# Patient Record
Sex: Female | Born: 1995 | Race: White | Hispanic: No | Marital: Single | Smoking: Current every day smoker
Health system: Southern US, Community
[De-identification: ages and names within clinical notes are randomized; demographics above are authoritative.]

## PROBLEM LIST (undated history)

## (undated) DIAGNOSIS — G8929 Other chronic pain: Secondary | ICD-10-CM

## (undated) DIAGNOSIS — F419 Anxiety disorder, unspecified: Secondary | ICD-10-CM

## (undated) DIAGNOSIS — F32A Depression, unspecified: Secondary | ICD-10-CM

## (undated) DIAGNOSIS — F319 Bipolar disorder, unspecified: Secondary | ICD-10-CM

## (undated) DIAGNOSIS — F329 Major depressive disorder, single episode, unspecified: Secondary | ICD-10-CM

## (undated) DIAGNOSIS — F4310 Post-traumatic stress disorder, unspecified: Secondary | ICD-10-CM

## (undated) DIAGNOSIS — R102 Pelvic and perineal pain: Secondary | ICD-10-CM

## (undated) DIAGNOSIS — N809 Endometriosis, unspecified: Secondary | ICD-10-CM

## (undated) HISTORY — PX: OTHER SURGICAL HISTORY: SHX169

---
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
## 2015-09-08 ENCOUNTER — Encounter
Admission: EM | Disposition: A | Discharge status: Home / Self Care | Payer: Self-pay | Source: Home / Self Care | Attending: Obstetrics & Gynecology

## 2015-09-08 ENCOUNTER — Inpatient Hospital Stay: Payer: Medicaid Other | Admitting: Anesthesiology

## 2015-09-08 ENCOUNTER — Inpatient Hospital Stay
Admission: EM | Admit: 2015-09-08 | Discharge: 2015-09-10 | DRG: 765 | Disposition: A | Discharge status: Home / Self Care | Payer: Medicaid Other | Attending: Obstetrics & Gynecology | Admitting: Obstetrics & Gynecology

## 2015-09-08 DIAGNOSIS — O99324 Drug use complicating childbirth: Secondary | ICD-10-CM | POA: Diagnosis present

## 2015-09-08 DIAGNOSIS — Z6741 Type O blood, Rh negative: Secondary | ICD-10-CM | POA: Diagnosis not present

## 2015-09-08 DIAGNOSIS — D62 Acute posthemorrhagic anemia: Secondary | ICD-10-CM | POA: Diagnosis present

## 2015-09-08 DIAGNOSIS — F1190 Opioid use, unspecified, uncomplicated: Secondary | ICD-10-CM | POA: Diagnosis present

## 2015-09-08 DIAGNOSIS — O99334 Smoking (tobacco) complicating childbirth: Secondary | ICD-10-CM | POA: Diagnosis present

## 2015-09-08 DIAGNOSIS — O34211 Maternal care for low transverse scar from previous cesarean delivery: Secondary | ICD-10-CM | POA: Diagnosis present

## 2015-09-08 DIAGNOSIS — O9902 Anemia complicating childbirth: Secondary | ICD-10-CM | POA: Diagnosis present

## 2015-09-08 DIAGNOSIS — Z3A38 38 weeks gestation of pregnancy: Secondary | ICD-10-CM

## 2015-09-08 DIAGNOSIS — O479 False labor, unspecified: Secondary | ICD-10-CM | POA: Diagnosis present

## 2015-09-08 DIAGNOSIS — Z98891 History of uterine scar from previous surgery: Secondary | ICD-10-CM

## 2015-09-08 LAB — CHLAMYDIA/NGC RT PCR (ARMC ONLY)
CHLAMYDIA TR: NOT DETECTED
N GONORRHOEAE: NOT DETECTED

## 2015-09-08 LAB — CBC WITH DIFFERENTIAL/PLATELET
BASOS ABS: 0 10*3/uL (ref 0–0.1)
EOS ABS: 0 10*3/uL (ref 0–0.7)
HCT: 34.7 % — ABNORMAL LOW (ref 35.0–47.0)
Hemoglobin: 11.7 g/dL — ABNORMAL LOW (ref 12.0–16.0)
Lymphocytes Relative: 4 %
Lymphs Abs: 0.5 10*3/uL — ABNORMAL LOW (ref 1.0–3.6)
MCH: 30.9 pg (ref 26.0–34.0)
MCHC: 33.6 g/dL (ref 32.0–36.0)
MCV: 91.9 fL (ref 80.0–100.0)
MONO ABS: 0.2 10*3/uL (ref 0.2–0.9)
Monocytes Relative: 2 %
Neutro Abs: 10.9 10*3/uL — ABNORMAL HIGH (ref 1.4–6.5)
Neutrophils Relative %: 94 %
Platelets: 279 10*3/uL (ref 150–440)
RBC: 3.77 MIL/uL — ABNORMAL LOW (ref 3.80–5.20)
RDW: 13.6 % (ref 11.5–14.5)
WBC: 11.6 10*3/uL — ABNORMAL HIGH (ref 3.6–11.0)

## 2015-09-08 LAB — TYPE AND SCREEN
ABO/RH(D): O NEG
ANTIBODY SCREEN: NEGATIVE

## 2015-09-08 LAB — URINE DRUG SCREEN, QUALITATIVE (ARMC ONLY)
Amphetamines, Ur Screen: NOT DETECTED
Barbiturates, Ur Screen: NOT DETECTED
Benzodiazepine, Ur Scrn: NOT DETECTED
CANNABINOID 50 NG, UR ~~LOC~~: NOT DETECTED
COCAINE METABOLITE, UR ~~LOC~~: NOT DETECTED
MDMA (Ecstasy)Ur Screen: NOT DETECTED
METHADONE SCREEN, URINE: NOT DETECTED
Opiate, Ur Screen: NOT DETECTED
Phencyclidine (PCP) Ur S: NOT DETECTED
Tricyclic, Ur Screen: NOT DETECTED

## 2015-09-08 LAB — RAPID HIV SCREEN (HIV 1/2 AB+AG)
HIV 1/2 ANTIBODIES: NONREACTIVE
HIV-1 P24 Antigen - HIV24: NONREACTIVE

## 2015-09-08 SURGERY — Surgical Case
Anesthesia: Spinal | Wound class: Clean Contaminated

## 2015-09-08 MED ORDER — LACTATED RINGERS IV SOLN
INTRAVENOUS | Status: DC
  Administered 2015-09-08: 15:00:00 via INTRAVENOUS

## 2015-09-08 MED ORDER — FENTANYL CITRATE (PF) 100 MCG/2ML IJ SOLN
INTRAMUSCULAR | Status: AC
  Administered 2015-09-08: 50 ug via INTRAVENOUS
  Filled 2015-09-08: qty 2

## 2015-09-08 MED ORDER — SOD CITRATE-CITRIC ACID 500-334 MG/5ML PO SOLN
ORAL | Status: AC
  Administered 2015-09-08: 30 mL
  Filled 2015-09-08: qty 15

## 2015-09-08 MED ORDER — OXYCODONE HCL 5 MG/5ML PO SOLN: 5.0000 mg | Freq: Once | ORAL | Status: AC | PRN

## 2015-09-08 MED ORDER — DIBUCAINE 1 % RE OINT
1.0000 | TOPICAL_OINTMENT | RECTAL | Status: DC | PRN
Start: 2015-09-08 — End: 2015-09-10

## 2015-09-08 MED ORDER — OXYCODONE HCL 5 MG PO TABS
5.0000 mg | ORAL_TABLET | Freq: Once | ORAL | Status: AC | PRN
Start: 2015-09-08 — End: 2015-09-09
  Administered 2015-09-09: 5 mg via ORAL
  Filled 2015-09-08: qty 1

## 2015-09-08 MED ORDER — FENTANYL CITRATE (PF) 100 MCG/2ML IJ SOLN
25.0000 ug | INTRAMUSCULAR | Status: DC | PRN
  Administered 2015-09-08 (×3): 50 ug via INTRAVENOUS
  Filled 2015-09-08: qty 2

## 2015-09-08 MED ORDER — BUPIVACAINE HCL (PF) 0.5 % IJ SOLN
INTRAMUSCULAR | Status: DC | PRN
  Administered 2015-09-08: 10 mL

## 2015-09-08 MED ORDER — KETOROLAC TROMETHAMINE 30 MG/ML IJ SOLN
30.0000 mg | Freq: Four times a day (QID) | INTRAMUSCULAR | Status: AC
  Administered 2015-09-08 – 2015-09-09 (×4): 30 mg via INTRAVENOUS
  Filled 2015-09-08 (×4): qty 1

## 2015-09-08 MED ORDER — COCONUT OIL OIL: 1.0000 "application " | TOPICAL_OIL | Status: DC | PRN

## 2015-09-08 MED ORDER — BUPIVACAINE 0.25 % ON-Q PUMP DUAL CATH 400 ML
400.0000 mL | INJECTION | Status: DC
  Filled 2015-09-08: qty 400

## 2015-09-08 MED ORDER — SODIUM CHLORIDE FLUSH 0.9 % IV SOLN
INTRAVENOUS | Status: AC
  Filled 2015-09-08: qty 20

## 2015-09-08 MED ORDER — OXYTOCIN 40 UNITS IN LACTATED RINGERS INFUSION - SIMPLE MED: INTRAVENOUS | Status: DC | PRN

## 2015-09-08 MED ORDER — BUPIVACAINE HCL 0.5 % IJ SOLN
10.0000 mL | Freq: Once | INTRAMUSCULAR | Status: DC
  Filled 2015-09-08: qty 10

## 2015-09-08 MED ORDER — WITCH HAZEL-GLYCERIN EX PADS: 1.0000 "application " | MEDICATED_PAD | CUTANEOUS | Status: DC | PRN

## 2015-09-08 MED ORDER — ZOLPIDEM TARTRATE 5 MG PO TABS: 5.0000 mg | ORAL_TABLET | Freq: Every evening | ORAL | Status: DC | PRN

## 2015-09-08 MED ORDER — SIMETHICONE 80 MG PO CHEW
80.0000 mg | CHEWABLE_TABLET | Freq: Three times a day (TID) | ORAL | Status: DC
  Administered 2015-09-09 – 2015-09-10 (×5): 80 mg via ORAL
  Filled 2015-09-08 (×5): qty 1

## 2015-09-08 MED ORDER — ACETAMINOPHEN 325 MG PO TABS
650.0000 mg | ORAL_TABLET | ORAL | Status: DC | PRN
Start: 2015-09-08 — End: 2015-09-08

## 2015-09-08 MED ORDER — SIMETHICONE 80 MG PO CHEW
80.0000 mg | CHEWABLE_TABLET | ORAL | Status: DC
Start: 2015-09-09 — End: 2015-09-10
  Administered 2015-09-08 – 2015-09-09 (×2): 80 mg via ORAL
  Filled 2015-09-08 (×2): qty 1

## 2015-09-08 MED ORDER — PHENYLEPHRINE HCL 10 MG/ML IJ SOLN
INTRAMUSCULAR | Status: DC | PRN
  Administered 2015-09-08 (×2): 100 ug via INTRAVENOUS

## 2015-09-08 MED ORDER — BUPIVACAINE IN DEXTROSE 0.75-8.25 % IT SOLN
INTRATHECAL | Status: DC | PRN
  Administered 2015-09-08: 1.8 mL via INTRATHECAL

## 2015-09-08 MED ORDER — PRENATAL MULTIVITAMIN CH
1.0000 | ORAL_TABLET | Freq: Every day | ORAL | Status: DC
Start: 2015-09-09 — End: 2015-09-10
  Administered 2015-09-09 – 2015-09-10 (×2): 1 via ORAL
  Filled 2015-09-08 (×2): qty 1

## 2015-09-08 MED ORDER — ONDANSETRON HCL 4 MG/2ML IJ SOLN
4.0000 mg | Freq: Once | INTRAMUSCULAR | Status: AC
  Administered 2015-09-08: 4 mg via INTRAVENOUS
  Filled 2015-09-08: qty 2

## 2015-09-08 MED ORDER — TETANUS-DIPHTH-ACELL PERTUSSIS 5-2.5-18.5 LF-MCG/0.5 IM SUSP: 0.5000 mL | Freq: Once | INTRAMUSCULAR | Status: DC

## 2015-09-08 MED ORDER — SIMETHICONE 80 MG PO CHEW: 80.0000 mg | CHEWABLE_TABLET | ORAL | Status: DC | PRN

## 2015-09-08 MED ORDER — SENNOSIDES-DOCUSATE SODIUM 8.6-50 MG PO TABS
2.0000 | ORAL_TABLET | ORAL | Status: DC
  Administered 2015-09-08 – 2015-09-09 (×2): 2 via ORAL
  Filled 2015-09-08 (×2): qty 2

## 2015-09-08 MED ORDER — OXYTOCIN 40 UNITS IN LACTATED RINGERS INFUSION - SIMPLE MED: 2.5000 [IU]/h | INTRAVENOUS | Status: AC

## 2015-09-08 MED ORDER — MENTHOL 3 MG MT LOZG: 1.0000 | LOZENGE | OROMUCOSAL | Status: DC | PRN

## 2015-09-08 MED ORDER — ACETAMINOPHEN 325 MG PO TABS: 650.0000 mg | ORAL_TABLET | ORAL | Status: DC | PRN

## 2015-09-08 MED ORDER — HYDROMORPHONE HCL 1 MG/ML IJ SOLN
1.0000 mg | INTRAMUSCULAR | Status: DC | PRN
  Administered 2015-09-08 – 2015-09-09 (×10): 1 mg via INTRAVENOUS
  Filled 2015-09-08 (×10): qty 1

## 2015-09-08 MED ORDER — PRENATAL MULTIVITAMIN CH: 1.0000 | ORAL_TABLET | Freq: Every day | ORAL | Status: DC

## 2015-09-08 MED ORDER — CEFOXITIN SODIUM-DEXTROSE 2-2.2 GM-% IV SOLR (PREMIX)
INTRAVENOUS | Status: AC
  Filled 2015-09-08: qty 50

## 2015-09-08 MED ORDER — NICOTINE 14 MG/24HR TD PT24
14.0000 mg | MEDICATED_PATCH | Freq: Every day | TRANSDERMAL | Status: DC | PRN
  Administered 2015-09-08 – 2015-09-09 (×2): 14 mg via TRANSDERMAL
  Filled 2015-09-08 (×2): qty 1

## 2015-09-08 MED ORDER — ONDANSETRON HCL 4 MG/2ML IJ SOLN: 4.0000 mg | Freq: Four times a day (QID) | INTRAMUSCULAR | Status: DC | PRN

## 2015-09-08 MED ORDER — BUPIVACAINE HCL (PF) 0.5 % IJ SOLN
INTRAMUSCULAR | Status: AC
  Filled 2015-09-08: qty 30

## 2015-09-08 MED ORDER — OXYTOCIN 40 UNITS IN LACTATED RINGERS INFUSION - SIMPLE MED
INTRAVENOUS | Status: AC
  Administered 2015-09-08: 1 mL via INTRAVENOUS
  Administered 2015-09-08: 299 mL via INTRAVENOUS
  Filled 2015-09-08: qty 1000

## 2015-09-08 MED ORDER — DIPHENHYDRAMINE HCL 25 MG PO CAPS: 25.0000 mg | ORAL_CAPSULE | Freq: Four times a day (QID) | ORAL | Status: DC | PRN

## 2015-09-08 MED ORDER — LACTATED RINGERS IV SOLN
INTRAVENOUS | Status: DC
  Administered 2015-09-09: 01:00:00 via INTRAVENOUS

## 2015-09-08 MED ORDER — CALCIUM CARBONATE ANTACID 500 MG PO CHEW: 2.0000 | CHEWABLE_TABLET | ORAL | Status: DC | PRN

## 2015-09-08 SURGICAL SUPPLY — 23 items
CANISTER SUCT 3000ML (MISCELLANEOUS) ×3 IMPLANT
CATH KIT ON-Q SILVERSOAK 5IN (CATHETERS) ×6 IMPLANT
CHLORAPREP W/TINT 26ML (MISCELLANEOUS) ×6 IMPLANT
DRSG OPSITE POSTOP 4X10 (GAUZE/BANDAGES/DRESSINGS) ×3 IMPLANT
ELECT CAUTERY BLADE 6.4 (BLADE) ×3 IMPLANT
ELECT REM PT RETURN 9FT ADLT (ELECTROSURGICAL) ×3
ELECTRODE REM PT RTRN 9FT ADLT (ELECTROSURGICAL) ×1 IMPLANT
GLOVE SKINSENSE NS SZ8.0 LF (GLOVE) ×2
GLOVE SKINSENSE STRL SZ8.0 LF (GLOVE) ×1 IMPLANT
GOWN STRL REUS W/ TWL LRG LVL3 (GOWN DISPOSABLE) ×1 IMPLANT
GOWN STRL REUS W/ TWL XL LVL3 (GOWN DISPOSABLE) ×2 IMPLANT
GOWN STRL REUS W/TWL LRG LVL3 (GOWN DISPOSABLE) ×2
GOWN STRL REUS W/TWL XL LVL3 (GOWN DISPOSABLE) ×4
LIQUID BAND (GAUZE/BANDAGES/DRESSINGS) ×3 IMPLANT
NS IRRIG 1000ML POUR BTL (IV SOLUTION) ×3 IMPLANT
PACK C SECTION AR (MISCELLANEOUS) ×3 IMPLANT
PAD OB MATERNITY 4.3X12.25 (PERSONAL CARE ITEMS) ×3 IMPLANT
PAD PREP 24X41 OB/GYN DISP (PERSONAL CARE ITEMS) ×3 IMPLANT
SUT MAXON ABS #0 GS21 30IN (SUTURE) ×6 IMPLANT
SUT VIC AB 0 CT1 36 (SUTURE) ×6 IMPLANT
SUT VIC AB 1 CT1 36 (SUTURE) ×12 IMPLANT
SUT VIC AB 2-0 CT1 36 (SUTURE) ×3 IMPLANT
SUT VIC AB 4-0 FS2 27 (SUTURE) ×3 IMPLANT

## 2015-09-08 NOTE — Plan of Care (Signed)
Problem: Education: Goal: Knowledge of Childbirth will improve Outcome: Completed/Met Date Met:  09/08/15 G2P1, 38.4 wks, Repeat c-section performed per Dr Kenton Kingfisher, delivery viable female infant at 37. 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 Incision remains dry and intact, skin adhesive and honeycomb dressing with no drainage or bleeding. Fundus firm at U, small lochia rubra, no clots. Pt given Toradol, Dilaudid and Fentanyl during recovery period. Pt discharged from PACU in stable condition, alert and oriented, able to lift up and bend knees. VSS, afebrile. Pt with s/o present to nursery to visit with baby prior to going to M/B unit.

## 2015-09-08 NOTE — Discharge Summary (Addendum)
  Obstetrical Discharge Summary  Date of Admission: 09/08/2015 Date of Discharge: 09/10/2015  Discharge Diagnosis: Term Pregnancy-delivered and no prenatal care and prior cesarean section Primary OB:  Other none   Gestational Age at Delivery: 5381w4d  Antepartum complications: no PNC, prior CS Date of Delivery: 09/10/2015  Delivered By: Annamarie MajorPaul Harris, MD Delivery Type: repeat cesarean section, low transverse incision Intrapartum complications/course: None Anesthesia: spinal Placenta: spontaneous Laceration: n/a Episiotomy: none Live born female  Birth Weight: 6 lb 7 oz (2920 g) APGAR: 8, 9   Post partum course: Since the delivery, patient has tolerated activity, diet, and daily functions without difficulty or complication.  Min lochia.  No breast concerns at this time.  No signs of depression currently.   Postpartum Exam:General appearance: alert and no distress  CV: RRR Pulm: unlabored breathing, regular rate, CTABL ABD: incision c/d/i, on-q pump in place, fundus firm and below umbilicus EXT: no edema or ttp  Disposition: home without infant Rh Immune globulin given: yes Rubella vaccine given: no - declined Varicella vaccine given: not applicable Tdap vaccine given in AP or PP setting: no - patient stated already given Flu vaccine given in AP or PP setting: not applicable Contraception: condoms  Prenatal Labs: O NEG//Rubella Not immune//RPR negative//HIV negative/HepB Surface Ag negative//plans to breastfeed  Plan:  Paula MarinJamie A Hester was discharged to home in good condition. [facility]  Discharge Medications:   Medication List    TAKE these medications        ibuprofen 600 MG tablet  Commonly known as:  ADVIL,MOTRIN  Take 1 tablet (600 mg total) by mouth every 6 (six) hours.     oxyCODONE-acetaminophen 5-325 MG tablet  Commonly known as:  PERCOCET/ROXICET  Take 1-2 tablets by mouth every 4 (four) hours as needed for severe pain.    Resume SUBUTEX as instructed by your prescribing doctor.    Follow-up arrangements:  Follow-up Information    Follow up with Letitia Libraobert Paul Harris, MD. Schedule an appointment as soon as possible for a visit in 1 week.   Specialty:  Obstetrics and Gynecology   Why:  For Post Op   Contact information:   [address] [place] [address] [phone]          ----- Ranae Plumberhelsea Kaushal Vannice, MD Attending Obstetrician and Gynecologist [facility]

## 2015-09-08 NOTE — Op Note (Signed)
Cesarean Section Procedure Note Indications: no prenatal care, prior cesarean section and term intrauterine pregnancy  Pre-operative Diagnosis: Intrauterine pregnancy 5991w4d ;  no prenatal care, prior cesarean section and term intrauterine pregnancy Post-operative Diagnosis: same, delivered. Procedure: Low Transverse Cesarean Section Surgeon: Annamarie MajorPaul Chianti Goh, MD, FACOG Assistant(s): Channing MuttersLori Brame Anesthesia: Spinal anesthesia Estimated Blood Loss:300  Complications: None; patient tolerated the procedure well. Disposition: PACU - hemodynamically stable. Condition: stable  Findings: A female infant in the cephalic presentation. Amniotic fluid - Other clear to yellow  Birth weight 2910 g.  Apgars of 8 and 9.  Intact placenta with a three-vessel cord. Grossly normal uterus, tubes and ovaries bilaterally. no intraabdominal adhesions were noted.  Procedure Details   The patient was taken to Operating Room, identified as the correct patient and the procedure verified as C-Section Delivery. A Time Out was held and the above information confirmed. After induction of anesthesia, the patient was draped and prepped in the usual sterile manner. A Pfannenstiel incision was made and carried down through the subcutaneous tissue to the fascia. Fascial incision was made and extended transversely with the Mayo scissors. The fascia was separated from the underlying rectus tissue superiorly and inferiorly. The peritoneum was identified and entered bluntly. Peritoneal incision was extended longitudinally. The utero-vesical peritoneal reflection was incised transversely and a bladder flap was created digitally.  A low transverse hysterotomy was made. The fetus was delivered atraumatically. The umbilical cord was clamped x2 and cut and the infant was handed to the awaiting pediatricians. The placenta was removed intact and appeared normal with a 3-vessel cord.  The uterus was exteriorized and cleared of all clot and debris.  The hysterotomy was closed with running sutures of 0 Vicryl suture. A second imbricating layer was placed with the same suture. Excellent hemostasis was observed. The uterus was returned to the abdomen. The pelvis was irrigated and again, excellent hemostasis was noted.  The On Q Pain pump System was then placed.  Trocars were placed through the abdominal wall into the subfascial space and these were used to thread the silver soaker cathaters into place.The rectus fascia was then reapproximated with running sutures of Maxon, with careful placement not to incorporate the cathaters. Subcutaneous tissues are then irrigated with saline and hemostasis assured.  Skin is then closed with 4-0 vicryl suture in a subcuticular fashion followed by skin adhesive. The cathaters are flushed each with 5 mL of Bupivicaine and stabilized into place with dressing. Instrument, sponge, and needle counts were correct prior to the abdominal closure and at the conclusion of the case.  The patient tolerated the procedure well and was transferred to the recovery room in stable condition.

## 2015-09-08 NOTE — Anesthesia Preprocedure Evaluation (Addendum)
Anesthesia Evaluation  Patient identified by MRN, date of birth, ID band Patient awake    Reviewed: Allergy & Precautions, H&P , NPO status , Patient's Chart, lab work & pertinent test results  History of Anesthesia Complications Negative for: history of anesthetic complications  Airway Mallampati: II  TM Distance: >3 FB Neck ROM: full    Dental  (+) Poor Dentition   Pulmonary asthma , Current Smoker,    Pulmonary exam normal breath sounds clear to auscultation       Cardiovascular Exercise Tolerance: Good negative cardio ROS Normal cardiovascular exam Rhythm:regular Rate:Normal     Neuro/Psych negative neurological ROS  negative psych ROS   GI/Hepatic Neg liver ROS, GERD  ,  Endo/Other  negative endocrine ROS  Renal/GU negative Renal ROS  negative genitourinary   Musculoskeletal   Abdominal   Peds  Hematology negative hematology ROS (+)   Anesthesia Other Findings Past Medical History:   Endometriosis                                                Chronic pelvic pain in female                   2013           Comment:[facility]  Past Surgical History:   ovarian cyst removed                                          CESAREAN SECTION                                             BMI    Body Mass Index   22.67 kg/m 2      Reproductive/Obstetrics (+) Pregnancy                            Anesthesia Physical Anesthesia Plan  ASA: III  Anesthesia Plan: Spinal   Post-op Pain Management:    Induction:   Airway Management Planned:   Additional Equipment:   Intra-op Plan:   Post-operative Plan:   Informed Consent: I have reviewed the patients History and Physical, chart, labs and discussed the procedure including the risks, benefits and alternatives for the proposed anesthesia with the patient or authorized representative who has indicated his/her understanding and acceptance.    Dental Advisory Given  Plan Discussed with: Anesthesiologist, CRNA and Surgeon  Anesthesia Plan Comments:         Anesthesia Quick Evaluation

## 2015-09-08 NOTE — Transfer of Care (Signed)
Immediate Anesthesia Transfer of Care Note  Patient: Paula Hester  Procedure(s) Performed: Procedure(s): CESAREAN SECTION (N/A)  Patient Location: PACU  Anesthesia Type:Spinal  Level of Consciousness: awake, alert , oriented and patient cooperative  Airway & Oxygen Therapy: Patient Spontanous Breathing  Post-op Assessment: Report given to RN and Post -op Vital signs reviewed and stable  Post vital signs: Reviewed and stable  Last Vitals:  Filed Vitals:   09/08/15 1452 09/08/15 1613  BP: 108/58   Pulse: 123   Temp:  37.9 C    Last Pain:  Filed Vitals:   09/08/15 1852  PainSc: 10-Worst pain ever         Complications: No apparent anesthesia complications

## 2015-09-08 NOTE — Plan of Care (Signed)
fhr was 125 in or. Foley cath #14 inserted in OR and SCD's applied.

## 2015-09-08 NOTE — H&P (Signed)
Obstetrics Admission History & Physical   CC: Contractions and pain   HPI:  20 y.o. G2P1 at 7438 4/7 weeks based on 2nd trimester US elsewhere;. Admitted on 09/08/2015:   Patient Active Problem List   Diagnosis Date Noted  . Irregular contractions 09/08/2015  . Labor abnormal 09/08/2015     Presents for contractions and back pain today.  [facility], none since.  No VB or ROM, just pain.  Prior pregnancy was emergent CS for abruption. Planning repeat CS.  [facility] earlier this pregnancy.  Drug use- Heroin- on Subutex for last 2 mos without relapse.  Prenatal care at: No prenatal care  PMHx:  Past Medical History  Diagnosis Date  . Endometriosis   . Chronic pelvic pain in female 2013    [facility]   PSHx:  Past Surgical History  Procedure Laterality Date  . Ovarian cyst removed    . Cesarean section     Medications:  Prescriptions prior to admission  Medication Sig Dispense Refill Last Dose  . acetaminophen (TYLENOL) 500 MG tablet Take 1,000 mg by mouth every 6 (six) hours as needed for moderate pain (pain).   09/08/2015  . cephALEXin (KEFLEX) 500 MG capsule Take 1 capsule (500 mg total) by mouth 4 (four) times daily. 40 capsule 0 09/08/2015  . ibuprofen (ADVIL,MOTRIN) 600 MG tablet Take 1 tablet (600 mg total) by mouth every 6 (six) hours as needed. 30 tablet 0 09/08/2015  . ondansetron (ZOFRAN) 4 MG tablet Take 1 tablet (4 mg total) by mouth every 6 (six) hours. 12 tablet 0 09/08/2015  . oxyCODONE-acetaminophen (PERCOCET/ROXICET) 5-325 MG per tablet Take 1 tablet by mouth every 4 (four) hours as needed for moderate pain or severe pain. 8 tablet 0 09/08/2015  . phenazopyridine (PYRIDIUM) 200 MG tablet Take 1 tablet (200 mg total) by mouth 3 (three) times daily. 6 tablet 0 09/08/2015  . traMADol (ULTRAM) 50 MG tablet Take 100 mg by mouth every 6 (six) hours as needed for moderate pain (pain).   09/08/2015   Allergies: is allergic to  naproxen; oxcarbazepine; hydrocodone; and morphine and related. OBHx:  OB History  No data available   ZOX:[id number]:Negative/unremarkable except as detailed in HPI. Soc Hx: Former smoker, Recreational drug use: former and on Subutex  Objective:   Filed Vitals:   09/08/15 1452 09/08/15 1613  BP: 108/58   Pulse: 123   Temp:  100.3 F (37.9 C)   General: Well nourished, well developed female in no acute distress.  Skin: Warm and dry.  Cardiovascular:Regular rate and rhythm. Respiratory: Clear to auscultation bilateral. Normal respiratory effort Abdomen: moderate Neuro/Psych: Normal mood and affect.   Pelvic exam: is not limited by body habitus EGBUS: within normal limits Vagina: within normal limits and with normal mucosa blood in the vault Cervix: 0.5 cm Uterus: Spontaneous uterine activity  Adnexa: not evaluated  EFM:FHR: 150 bpm, variability: moderate,  accelerations:  Present,  decelerations:  Absent Toco: Frequency: Every 4-5 minutes   Assessment & Plan:   20 y.o. G2P1, no PNC, 38 weeks, labor, need for repeat CS.   Admitted on 09/08/2015:     Admit for labor and Fetal Wellbeing Reassuring  Plan CS Consent The risks of cesarean section discussed with the patient included but were not limited to: bleeding which may require transfusion or reoperation; infection which may require antibiotics; injury to bowel, bladder, ureters or other surrounding organs; injury to the  fetus; need for additional procedures including hysterectomy in the event of a life-threatening hemorrhage; placental abnormalities wth subsequent pregnancies, incisional problems, thromboembolic phenomenon and other postoperative/anesthesia complications. The patient concurred with the proposed plan, giving informed written consent for the procedure.

## 2015-09-08 NOTE — Plan of Care (Signed)
Dr Tiburcio Peaharris notified of temp 100.3

## 2015-09-08 NOTE — Plan of Care (Signed)
Waiting on type and screen results before taking pt back for csection. Pt states the only medication she takes is subutex and she won't be able to take that for a week after baby is born. [place] clinic in [place].

## 2015-09-08 NOTE — Anesthesia Procedure Notes (Signed)
Spinal Patient location during procedure: OR Start time: 09/08/2015 6:23 PM End time: 09/08/2015 6:25 PM Staffing Anesthesiologist: Katy Fitch K Performed by: anesthesiologist  Preanesthetic Checklist Completed: patient identified, site marked, surgical consent, pre-op evaluation, timeout performed, IV checked, risks and benefits discussed and monitors and equipment checked Spinal Block Patient position: sitting Prep: ChloraPrep Patient monitoring: heart rate, continuous pulse ox, blood pressure and cardiac monitor Approach: midline Location: L3-4 Injection technique: single-shot Needle Needle type: Whitacre and Introducer  Needle gauge: 24 G Needle length: 9 cm Assessment Sensory level: T4 Additional Notes Negative paresthesia. Negative blood return. Positive free-flowing CSF. Expiration date of kit checked and confirmed. Patient tolerated procedure well, without complications.

## 2015-09-09 ENCOUNTER — Encounter: Payer: Self-pay | Admitting: Obstetrics & Gynecology

## 2015-09-09 LAB — CBC
HEMATOCRIT: 33.6 % — AB (ref 35.0–47.0)
Hemoglobin: 11.3 g/dL — ABNORMAL LOW (ref 12.0–16.0)
MCH: 30.9 pg (ref 26.0–34.0)
MCHC: 33.7 g/dL (ref 32.0–36.0)
MCV: 91.7 fL (ref 80.0–100.0)
Platelets: 251 10*3/uL (ref 150–440)
RBC: 3.66 MIL/uL — AB (ref 3.80–5.20)
RDW: 13.8 % (ref 11.5–14.5)
WBC: 11.4 10*3/uL — AB (ref 3.6–11.0)

## 2015-09-09 LAB — FETAL SCREEN: Fetal Screen: NEGATIVE

## 2015-09-09 LAB — HEPATITIS B SURFACE ANTIGEN: HEP B S AG: NEGATIVE

## 2015-09-09 LAB — RPR: RPR Ser Ql: NONREACTIVE

## 2015-09-09 MED ORDER — RHO D IMMUNE GLOBULIN 1500 UNIT/2ML IJ SOSY
300.0000 ug | PREFILLED_SYRINGE | Freq: Once | INTRAMUSCULAR | Status: AC
  Administered 2015-09-09: 300 ug via INTRAVENOUS
  Filled 2015-09-09: qty 2

## 2015-09-09 MED ORDER — OXYCODONE-ACETAMINOPHEN 5-325 MG PO TABS
1.0000 | ORAL_TABLET | ORAL | Status: DC | PRN
  Administered 2015-09-09 – 2015-09-10 (×6): 2 via ORAL
  Filled 2015-09-09 (×6): qty 2

## 2015-09-09 NOTE — Progress Notes (Signed)
Post Op Day 1 Visit to pt room at 1015. Unable to write note until 1220 due to being needed on L&D. Subjective:   Pt states her pain continues at 7 or greater on her PO meds. She had just received a dose of dilaudid prior to me being in the room at 1015 this am so she was visibly uncomfortable. She continues with IV and foley catheter so has not been out of bed as of 1015 this am. She is tolerating PO intake and she states her bleeding is light. Pt states that she was told by staff last night that after she had her foley and IV taken out she could go out to smoke. Pt informed that smoking was not encouraged and that [facility] is a smoke free campus. She was offered a nicotine patch and said that she had one placed last night and it was not working and it makes her arm itchy and irritated. Pt is aware that once she has her foley and IV out and she is able to ambulate without difficulty that she is allowed to go off the postpartum unit and she knows that smoking is discouraged. Pt states she cannot- due to her endometriosis- and will not use any hormonal birth control and that she does not plan to use condoms either. She states that she will not get pregnant.  Objective:  Blood pressure 94/63, pulse 78, temperature 98.5 F (36.9 C), temperature source Oral, resp. rate 18, height 5\' 2"  (1.575 m), weight 56.246 kg (124 lb), last menstrual period 12/12/2014, SpO2 97 %.  General: Displays signs of pain with facial expression and repositioning in bed Pulmonary: no increased work of breathing Abdomen: non-distended, non-tender, fundus firm at level of umbilicus Incision: Dressing is C/D/I Extremities: no edema, no erythema, no tenderness  Results for orders placed or performed during the hospital encounter of 09/08/15 (from the past 24 hour(s))  Urine Drug Screen, Qualitative ([facility] only)     Status: None   Collection Time: 09/08/15  3:20 PM  Result Value Ref Range   Tricyclic, Ur Screen NONE DETECTED NONE  DETECTED   Amphetamines, Ur Screen NONE DETECTED NONE DETECTED   MDMA (Ecstasy)Ur Screen NONE DETECTED NONE DETECTED   Cocaine Metabolite,Ur [place] NONE DETECTED NONE DETECTED   Opiate, Ur Screen NONE DETECTED NONE DETECTED   Phencyclidine (PCP) Ur S NONE DETECTED NONE DETECTED   Cannabinoid 50 Ng, Ur  NONE DETECTED NONE DETECTED   Barbiturates, Ur Screen NONE DETECTED NONE DETECTED   Benzodiazepine, Ur Scrn NONE DETECTED NONE DETECTED   Methadone Scn, Ur NONE DETECTED NONE DETECTED  Chlamydia/NGC rt PCR ([facility] only)     Status: None   Collection Time: 09/08/15  3:20 PM  Result Value Ref Range   Specimen source GC/Chlam URINE, RANDOM    Chlamydia Tr NOT DETECTED NOT DETECTED   N gonorrhoeae NOT DETECTED NOT DETECTED  Type and screen [facility]     Status: None   Collection Time: 09/08/15  3:53 PM  Result Value Ref Range   ABO/RH(D) O NEG    Antibody Screen NEG    Sample Expiration 09/11/2015   RPR     Status: None   Collection Time: 09/08/15  3:59 PM  Result Value Ref Range   RPR Ser Ql Non Reactive Non Reactive  CBC with Differential/Platelet     Status: Abnormal   Collection Time: 09/08/15  3:59 PM  Result Value Ref Range   WBC 11.6 (H) 3.6 -  11.0 K/uL   RBC 3.77 (L) 3.80 - 5.20 MIL/uL   Hemoglobin 11.7 (L) 12.0 - 16.0 g/dL   HCT 16.134.7 (L) 09.635.0 - 04.547.0 %   MCV 91.9 80.0 - 100.0 fL   MCH 30.9 26.0 - 34.0 pg   MCHC 33.6 32.0 - 36.0 g/dL   RDW 40.913.6 81.111.5 - 91.414.5 %   Platelets 279 150 - 440 K/uL   Neutrophils Relative % 94% %   Neutro Abs 10.9 (H) 1.4 - 6.5 K/uL   Lymphocytes Relative 4% %   Lymphs Abs 0.5 (L) 1.0 - 3.6 K/uL   Monocytes Relative 2% %   Monocytes Absolute 0.2 0.2 - 0.9 K/uL   Eosinophils Relative 0% %   Eosinophils Absolute 0.0 0 - 0.7 K/uL   Basophils Relative 0% %   Basophils Absolute 0.0 0 - 0.1 K/uL  Rapid HIV screen (HIV 1/2 Ab+Ag)     Status: None   Collection Time: 09/08/15  3:59 PM  Result Value Ref Range   HIV-1 P24 Antigen -  HIV24 NON REACTIVE NON REACTIVE   HIV 1/2 Antibodies NON REACTIVE NON REACTIVE   Interpretation (HIV Ag Ab)      A non reactive test result means that HIV 1 or HIV 2 antibodies and HIV 1 p24 antigen were not detected in the specimen.  Hepatitis B surface antigen     Status: None   Collection Time: 09/08/15  3:59 PM  Result Value Ref Range   Hepatitis B Surface Ag Negative Negative  Fetal screen     Status: None   Collection Time: 09/09/15  6:18 AM  Result Value Ref Range   Fetal Screen NEG   Rhogam injection     Status: None (Preliminary result)   Collection Time: 09/09/15  6:18 AM  Result Value Ref Range   Unit Number [id number]    Blood Component Type RHIG    Unit division 00    Status of Unit ALLOCATED    Transfusion Status OK TO TRANSFUSE   CBC     Status: Abnormal   Collection Time: 09/09/15  6:18 AM  Result Value Ref Range   WBC 11.4 (H) 3.6 - 11.0 K/uL   RBC 3.66 (L) 3.80 - 5.20 MIL/uL   Hemoglobin 11.3 (L) 12.0 - 16.0 g/dL   HCT 78.433.6 (L) 69.635.0 - 29.547.0 %   MCV 91.7 80.0 - 100.0 fL   MCH 30.9 26.0 - 34.0 pg   MCHC 33.7 32.0 - 36.0 g/dL   RDW 28.413.8 13.211.5 - 44.014.5 %   Platelets 251 150 - 440 K/uL   @I /O24@   Assessment:   20 y.o. G2P1001 postoperativeday # 1   Plan:  1) Acute blood loss anemia - hemodynamically stable and asymptomatic - po ferrous sulfate  2) O negative /  Rubella status unknown / Varicella status unknown / No prenatal care  3) TDAP status not given during prenatal   4) Bottle/Contraception: Encouraged to consider options and to talk with provider at 1 week incision check  5) Disposition: Depends on postpartum progress   Mauri Tolen, CNM
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
## 2015-09-09 NOTE — Progress Notes (Signed)
Patient requests to be saline locked so she can walk her 20 year old son and mother down stairs as this is the only time she is able to spend with her son. I advised mom not to leave the floor as her pain is not well controlled. I told her that I would not be able to monitor her or help her off of this floor. Patient understands the risks and is still deciding to go downstairs. Asked patient to please not be gone long if she has to go, and to please notify me as soon as she returns. IV saline locked for now.   Imagene ShellerMegan Kazuki Ingle, RN

## 2015-09-09 NOTE — Care Management (Signed)
Consult to care management for social issues. Will update clinical social worker and sign off this case. Gwenette GreetBrenda S Dorla Guizar RN MSN CCM Care Management [phone]

## 2015-09-09 NOTE — Anesthesia Postprocedure Evaluation (Signed)
Anesthesia Post Note  Patient: Paula Hester  Procedure(s) Performed: Procedure(s) (LRB): CESAREAN SECTION (N/A)  Patient location during evaluation: Mother Baby Anesthesia Type: Spinal Level of consciousness: awake and awake and alert Pain management: pain level controlled Vital Signs Assessment: post-procedure vital signs reviewed and stable Respiratory status: spontaneous breathing and nonlabored ventilation Cardiovascular status: blood pressure returned to baseline and stable Postop Assessment: no headache, no backache and no signs of nausea or vomiting Anesthetic complications: no    Last Vitals:  Filed Vitals:   09/09/15 0141 09/09/15 0317  BP: 103/66 95/55  Pulse: 87 80  Temp: 37 C 36.8 C  Resp: 18 18    Last Pain:  Filed Vitals:   09/09/15 0717  PainSc: 6                  Chiropodisttephanie Garrett Mitchum

## 2015-09-10 LAB — RHOGAM INJECTION: Unit division: 0

## 2015-09-10 LAB — SURGICAL PATHOLOGY

## 2015-09-10 LAB — VARICELLA ZOSTER ANTIBODY, IGG: Varicella IgG: 281 index (ref >165)

## 2015-09-10 LAB — RUBELLA ANTIBODY, IGM: Rubella IgM: <20 AU/mL (ref 0.0–19.9)

## 2015-09-10 MED ORDER — IBUPROFEN 600 MG PO TABS: 600.0000 mg | ORAL_TABLET | Freq: Four times a day (QID) | ORAL | Status: DC

## 2015-09-10 MED ORDER — OXYCODONE-ACETAMINOPHEN 5-325 MG PO TABS: 1.0000 | ORAL_TABLET | ORAL | Status: DC | PRN

## 2015-09-10 NOTE — Progress Notes (Signed)
  Post-operative Day 2  Subjective: up ad lib, voiding, tolerating PO, + flatus and still with moderate amount of pain, managed fairly well with po meds  Objective: Blood pressure 102/62, pulse 98, temperature 97.7 F (36.5 C), temperature source Oral, resp. rate 20, height '5\' 2"'$  (1.575 m), weight 124 lb (56.246 kg), last menstrual period 12/12/2014, SpO2 98 %  Physical Exam:  General: alert and cooperative, declined fundal exam d/t recent exam by RN Lochia: appropriate Uterine Fundus: Pt declined Incision: healing well, no significant drainage DVT Evaluation: No evidence of DVT seen on physical exam. Abdomen: soft, NT +BS   Recent Labs  09/08/15 1559 09/09/15 0618  HGB 11.7* 11.3*  HCT 34.7* 33.6*    Assessment POD #2  Plan: Continue PO care - pt possibly interested in d/c home later today after visit with CSW. Baby in SCN d/t elevated NAS d/t pt's h/o heroine and subutex use  Feeding: bottle Contraception: declined Blood Type: O neg (received rhogam) RNI (declined MMR)/V Immune TDAP not received d/t no PNC    Burlene Arnt, [place] 09/10/2015, 10:07 AM

## 2015-09-10 NOTE — Progress Notes (Signed)
Discharge instructions complete and prescriptions given. Patient verbalizes understanding of teaching. Patient discharged home at 1720. Pt. Requests to stop by SCN for a minute before leaving.

## 2015-09-10 NOTE — Discharge Instructions (Signed)
Cesarean Delivery, Care After °Refer to this sheet in the next few weeks. These instructions provide you with information on caring for yourself after your procedure. Your health care provider may also give you specific instructions. Your treatment has been planned according to current medical practices, but problems sometimes occur. Call your health care provider if you have any problems or questions after you go home. °HOME CARE INSTRUCTIONS  °· Only take over-the-counter or prescription medications as directed by your health care provider. °· Do not drink alcohol, especially if you are breastfeeding or taking medication to relieve pain. °· Do not chew or smoke tobacco. °· Continue to use good perineal care. Good perineal care includes: °¨ Wiping your perineum from front to back. °¨ Keeping your perineum clean. °· Check your surgical cut (incision) daily for increased redness, drainage, swelling, or separation of skin. °· Clean your incision gently with soap and water every day, and then pat it dry. If your health care provider says it is okay, leave the incision uncovered. Use a bandage (dressing) if the incision is draining fluid or appears irritated. If the adhesive strips across the incision do not fall off within 7 days, carefully peel them off. °· Hug a pillow when coughing or sneezing until your incision is healed. This helps to relieve pain. °· Do not use tampons or douche until your health care provider says it is okay. °· Shower, wash your hair, and take tub baths as directed by your health care provider. °· Wear a well-fitting bra that provides breast support. °· Limit wearing support panties or control-top hose. °· Drink enough fluids to keep your urine clear or pale yellow. °· Eat high-fiber foods such as whole grain cereals and breads, brown rice, beans, and fresh fruits and vegetables every day. These foods may help prevent or relieve constipation. °· Resume activities such as climbing stairs,  driving, lifting, exercising, or traveling as directed by your health care provider. °· Talk to your health care provider about resuming sexual activities. This is dependent upon your risk of infection, your rate of healing, and your comfort and desire to resume sexual activity. °· Try to have someone help you with your household activities and your newborn for at least a few days after you leave the hospital. °· Rest as much as possible. Try to rest or take a nap when your newborn is sleeping. °· Increase your activities gradually. °· Keep all of your scheduled postpartum appointments. It is very important to keep your scheduled follow-up appointments. At these appointments, your health care provider will be checking to make sure that you are healing physically and emotionally. °SEEK MEDICAL CARE IF:  °· You are passing large clots from your vagina. Save any clots to show your health care provider. °· You have a foul smelling discharge from your vagina. °· You have trouble urinating. °· You are urinating frequently. °· You have pain when you urinate. °· You have a change in your bowel movements. °· You have increasing redness, pain, or swelling near your incision. °· You have pus draining from your incision. °· Your incision is separating. °· You have painful, hard, or reddened breasts. °· You have a severe headache. °· You have blurred vision or see spots. °· You feel sad or depressed. °· You have thoughts of hurting yourself or your newborn. °· You have questions about your care, the care of your newborn, or medications. °· You are dizzy or light-headed. °· You have a rash. °· You   have pain, redness, or swelling at the site of the removed intravenous access (IV) tube.  You have nausea or vomiting.  You stopped breastfeeding and have not had a menstrual period within 12 weeks of stopping.  You are not breastfeeding and have not had a menstrual period within 12 weeks of delivery.  You have a fever. SEEK  IMMEDIATE MEDICAL CARE IF:  You have persistent pain.  You have chest pain.  You have shortness of breath.  You faint.  You have leg pain.  You have stomach pain.  Your vaginal bleeding saturates 2 or more sanitary pads in 1 hour. MAKE SURE YOU:   Understand these instructions.  Will watch your condition.  Will get help right away if you are not doing well or get worse.   This information is not intended to replace advice given to you by your health care provider. Make sure you discuss any questions you have with your health care provider.   Document Released: 11/20/2001 Document Revised: 03/21/2014 Document Reviewed: 10/26/2011 Elsevier Interactive Patient Education 2016 [employer]   Please resume your Subutex AS INSTRUCTED by your prescribing doctors.

## 2015-09-10 NOTE — Progress Notes (Signed)
Patient On-Q pump is leaking at site. Medium amount of serosanguineous fluid under Tegaderm. Dressing needs to be changed. Patient states she is in a lot of pain and requested I stop touching her abdomen, so dressing could not be changed at this time. I gave patient pain med and explained that dressing should be changed and I would come back after pain med has kicked in to try to change dressing at On-Q pump site.

## 2015-11-18 ENCOUNTER — Emergency Department
Admission: EM | Admit: 2015-11-18 | Discharge: 2015-11-18 | Disposition: A | Discharge status: Home / Self Care | Payer: Medicaid Other | Attending: Emergency Medicine | Admitting: Emergency Medicine

## 2015-11-18 ENCOUNTER — Encounter: Payer: Self-pay | Admitting: Emergency Medicine

## 2015-11-18 DIAGNOSIS — Y939 Activity, unspecified: Secondary | ICD-10-CM | POA: Insufficient documentation

## 2015-11-18 DIAGNOSIS — R102 Pelvic and perineal pain: Secondary | ICD-10-CM | POA: Diagnosis not present

## 2015-11-18 DIAGNOSIS — F17210 Nicotine dependence, cigarettes, uncomplicated: Secondary | ICD-10-CM | POA: Diagnosis not present

## 2015-11-18 DIAGNOSIS — Y999 Unspecified external cause status: Secondary | ICD-10-CM | POA: Diagnosis not present

## 2015-11-18 DIAGNOSIS — Y929 Unspecified place or not applicable: Secondary | ICD-10-CM | POA: Diagnosis not present

## 2015-11-18 DIAGNOSIS — S40022A Contusion of left upper arm, initial encounter: Secondary | ICD-10-CM | POA: Insufficient documentation

## 2015-11-18 DIAGNOSIS — R51 Headache: Secondary | ICD-10-CM | POA: Diagnosis not present

## 2015-11-18 DIAGNOSIS — Z5181 Encounter for therapeutic drug level monitoring: Secondary | ICD-10-CM | POA: Insufficient documentation

## 2015-11-18 DIAGNOSIS — N939 Abnormal uterine and vaginal bleeding, unspecified: Secondary | ICD-10-CM | POA: Insufficient documentation

## 2015-11-18 DIAGNOSIS — S40021A Contusion of right upper arm, initial encounter: Secondary | ICD-10-CM | POA: Diagnosis not present

## 2015-11-18 HISTORY — DX: Anxiety disorder, unspecified: F41.9

## 2015-11-18 HISTORY — DX: Post-traumatic stress disorder, unspecified: F43.10

## 2015-11-18 HISTORY — DX: Bipolar disorder, unspecified: F31.9

## 2015-11-18 LAB — CBC WITH DIFFERENTIAL/PLATELET
BASOS ABS: 0.1 10*3/uL (ref 0–0.1)
Basophils Relative: 1 %
Eosinophils Absolute: 0.2 10*3/uL (ref 0–0.7)
Eosinophils Relative: 2 %
HEMATOCRIT: 39.3 % (ref 35.0–47.0)
Hemoglobin: 13.8 g/dL (ref 12.0–16.0)
LYMPHS ABS: 3 10*3/uL (ref 1.0–3.6)
LYMPHS PCT: 39 %
MCH: 31.1 pg (ref 26.0–34.0)
MCHC: 35.2 g/dL (ref 32.0–36.0)
MCV: 88.3 fL (ref 80.0–100.0)
Monocytes Absolute: 0.5 10*3/uL (ref 0.2–0.9)
Monocytes Relative: 6 %
NEUTROS ABS: 3.9 10*3/uL (ref 1.4–6.5)
Neutrophils Relative %: 52 %
Platelets: 276 10*3/uL (ref 150–440)
RBC: 4.45 MIL/uL (ref 3.80–5.20)
RDW: 14.4 % (ref 11.5–14.5)
WBC: 7.6 10*3/uL (ref 3.6–11.0)

## 2015-11-18 LAB — URINE DRUG SCREEN, QUALITATIVE (ARMC ONLY)
Amphetamines, Ur Screen: NOT DETECTED
BARBITURATES, UR SCREEN: NOT DETECTED
BENZODIAZEPINE, UR SCRN: POSITIVE — AB
CANNABINOID 50 NG, UR ~~LOC~~: NOT DETECTED
COCAINE METABOLITE, UR ~~LOC~~: POSITIVE — AB
MDMA (Ecstasy)Ur Screen: NOT DETECTED
Methadone Scn, Ur: NOT DETECTED
Opiate, Ur Screen: POSITIVE — AB
Phencyclidine (PCP) Ur S: NOT DETECTED
TRICYCLIC, UR SCREEN: NOT DETECTED

## 2015-11-18 LAB — COMPREHENSIVE METABOLIC PANEL
ALK PHOS: 54 U/L (ref 38–126)
ALT: 79 U/L — AB (ref 14–54)
AST: 45 U/L — AB (ref 15–41)
Albumin: 4.4 g/dL (ref 3.5–5.0)
Anion gap: 8 (ref 5–15)
BILIRUBIN TOTAL: 0.2 mg/dL — AB (ref 0.3–1.2)
BUN: 17 mg/dL (ref 6–20)
CALCIUM: 9.2 mg/dL (ref 8.9–10.3)
CO2: 24 mmol/L (ref 22–32)
CREATININE: 0.7 mg/dL (ref 0.44–1.00)
Chloride: 105 mmol/L (ref 101–111)
Glucose, Bld: 124 mg/dL — ABNORMAL HIGH (ref 65–99)
Potassium: 3.4 mmol/L — ABNORMAL LOW (ref 3.5–5.1)
Sodium: 137 mmol/L (ref 135–145)
TOTAL PROTEIN: 7.6 g/dL (ref 6.5–8.1)

## 2015-11-18 LAB — ETHANOL

## 2015-11-18 LAB — ACETAMINOPHEN LEVEL: Acetaminophen (Tylenol), Serum: <10 ug/mL — ABNORMAL LOW (ref 10–30)

## 2015-11-18 LAB — HCG, QUANTITATIVE, PREGNANCY

## 2015-11-18 LAB — SALICYLATE LEVEL: Salicylate Lvl: <4 mg/dL (ref 2.8–30.0)

## 2015-11-18 MED ORDER — NALOXONE HCL 2 MG/2ML IJ SOSY
PREFILLED_SYRINGE | INTRAMUSCULAR | Status: AC
  Filled 2015-11-18: qty 2

## 2015-11-18 NOTE — ED Notes (Signed)
SANE nurse at bedside.

## 2015-11-18 NOTE — ED Notes (Signed)
Pt reports she's unable to urinate. Pt requesting door stay closed, pt also very sleepy compared to initial LOC upon arrival.

## 2015-11-18 NOTE — ED Notes (Signed)
Attorney on phone for patient. Pt given phone.
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
## 2015-11-18 NOTE — ED Notes (Signed)
SANE nurse and Recruitment consultantBurlington officer still at bedside with pt - SANE nurse to let this nurse know when she is finished so pt can be discharged

## 2015-11-18 NOTE — ED Notes (Signed)
SANE nurse paged
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
## 2015-11-18 NOTE — ED Notes (Signed)
Notified MD of pt BP - he ordered NACL bolus and Narcan due to decreased alertness - pt refused IV, IV bolus and Narcan - MD Mayford KnifeWilliams notified
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
## 2015-11-20 ENCOUNTER — Emergency Department
Admission: EM | Admit: 2015-11-20 | Discharge: 2015-11-21 | Disposition: A | Discharge status: Home / Self Care | Payer: Medicaid Other | Attending: Emergency Medicine | Admitting: Emergency Medicine

## 2015-11-20 ENCOUNTER — Encounter: Payer: Self-pay | Admitting: Emergency Medicine

## 2015-11-20 DIAGNOSIS — Z046 Encounter for general psychiatric examination, requested by authority: Secondary | ICD-10-CM | POA: Diagnosis present

## 2015-11-20 DIAGNOSIS — F329 Major depressive disorder, single episode, unspecified: Secondary | ICD-10-CM | POA: Diagnosis not present

## 2015-11-20 DIAGNOSIS — F32A Depression, unspecified: Secondary | ICD-10-CM

## 2015-11-20 DIAGNOSIS — Z79899 Other long term (current) drug therapy: Secondary | ICD-10-CM | POA: Diagnosis not present

## 2015-11-20 DIAGNOSIS — F1910 Other psychoactive substance abuse, uncomplicated: Secondary | ICD-10-CM | POA: Insufficient documentation

## 2015-11-20 DIAGNOSIS — Z791 Long term (current) use of non-steroidal anti-inflammatories (NSAID): Secondary | ICD-10-CM | POA: Insufficient documentation

## 2015-11-20 DIAGNOSIS — F1190 Opioid use, unspecified, uncomplicated: Secondary | ICD-10-CM | POA: Diagnosis not present

## 2015-11-20 DIAGNOSIS — F17210 Nicotine dependence, cigarettes, uncomplicated: Secondary | ICD-10-CM | POA: Insufficient documentation

## 2015-11-20 LAB — POCT PREGNANCY, URINE: PREG TEST UR: NEGATIVE
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
## 2015-11-21 LAB — CBC
HEMATOCRIT: 44.6 % (ref 35.0–47.0)
Hemoglobin: 15 g/dL (ref 12.0–16.0)
MCH: 29.7 pg (ref 26.0–34.0)
MCHC: 33.7 g/dL (ref 32.0–36.0)
MCV: 88.3 fL (ref 80.0–100.0)
PLATELETS: 363 10*3/uL (ref 150–440)
RBC: 5.05 MIL/uL (ref 3.80–5.20)
RDW: 14.4 % (ref 11.5–14.5)
WBC: 10.2 10*3/uL (ref 3.6–11.0)

## 2015-11-21 LAB — COMPREHENSIVE METABOLIC PANEL
ALBUMIN: 5.1 g/dL — AB (ref 3.5–5.0)
ALK PHOS: 62 U/L (ref 38–126)
ALT: 104 U/L — ABNORMAL HIGH (ref 14–54)
AST: 65 U/L — ABNORMAL HIGH (ref 15–41)
Anion gap: 8 (ref 5–15)
BILIRUBIN TOTAL: 0.4 mg/dL (ref 0.3–1.2)
BUN: 13 mg/dL (ref 6–20)
CALCIUM: 10.1 mg/dL (ref 8.9–10.3)
CHLORIDE: 104 mmol/L (ref 101–111)
CO2: 29 mmol/L (ref 22–32)
CREATININE: 0.8 mg/dL (ref 0.44–1.00)
Glucose, Bld: 96 mg/dL (ref 65–99)
Potassium: 3.1 mmol/L — ABNORMAL LOW (ref 3.5–5.1)
Sodium: 141 mmol/L (ref 135–145)
Total Protein: 8.9 g/dL — ABNORMAL HIGH (ref 6.5–8.1)

## 2015-11-21 LAB — URINE DRUG SCREEN, QUALITATIVE (ARMC ONLY)
AMPHETAMINES, UR SCREEN: NOT DETECTED
BARBITURATES, UR SCREEN: NOT DETECTED
BENZODIAZEPINE, UR SCRN: POSITIVE — AB
Cannabinoid 50 Ng, Ur ~~LOC~~: POSITIVE — AB
Cocaine Metabolite,Ur ~~LOC~~: POSITIVE — AB
MDMA (Ecstasy)Ur Screen: NOT DETECTED
METHADONE SCREEN, URINE: NOT DETECTED
OPIATE, UR SCREEN: POSITIVE — AB
PHENCYCLIDINE (PCP) UR S: NOT DETECTED
Tricyclic, Ur Screen: NOT DETECTED

## 2015-11-21 LAB — ETHANOL

## 2015-11-21 MED ORDER — DICYCLOMINE HCL 10 MG PO CAPS
10.0000 mg | ORAL_CAPSULE | Freq: Once | ORAL | Status: AC
  Administered 2015-11-21: 10 mg via ORAL
  Filled 2015-11-21: qty 1

## 2015-11-21 MED ORDER — ONDANSETRON 4 MG PO TBDP
ORAL_TABLET | ORAL | Status: AC
Start: 2015-11-21 — End: 2015-11-21
  Administered 2015-11-21: 4 mg
  Filled 2015-11-21: qty 1

## 2015-11-21 MED ORDER — LORAZEPAM 1 MG PO TABS
1.0000 mg | ORAL_TABLET | Freq: Once | ORAL | Status: AC
  Administered 2015-11-21: 1 mg via ORAL

## 2015-11-21 MED ORDER — DIPHENHYDRAMINE HCL 25 MG PO CAPS
50.0000 mg | ORAL_CAPSULE | Freq: Once | ORAL | Status: AC
  Administered 2015-11-21: 50 mg via ORAL
  Filled 2015-11-21: qty 2

## 2015-11-21 MED ORDER — PROMETHAZINE HCL 25 MG/ML IJ SOLN
25.0000 mg | Freq: Four times a day (QID) | INTRAMUSCULAR | Status: DC | PRN
  Administered 2015-11-21: 25 mg via INTRAMUSCULAR
  Filled 2015-11-21: qty 1

## 2015-11-21 MED ORDER — DICYCLOMINE HCL 10 MG PO CAPS
ORAL_CAPSULE | ORAL | Status: AC
Start: 2015-11-21 — End: 2015-11-21
  Administered 2015-11-21: 20 mg
  Filled 2015-11-21: qty 2

## 2015-11-21 MED ORDER — PROMETHAZINE HCL 25 MG/ML IJ SOLN
INTRAMUSCULAR | Status: AC
  Administered 2015-11-21: 25 mg via INTRAMUSCULAR
  Filled 2015-11-21: qty 1

## 2015-11-21 MED ORDER — LORAZEPAM 1 MG PO TABS
ORAL_TABLET | ORAL | Status: AC
  Administered 2015-11-21: 1 mg
  Filled 2015-11-21: qty 1

## 2015-11-21 MED ORDER — ONDANSETRON 4 MG PO TBDP
4.0000 mg | ORAL_TABLET | Freq: Once | ORAL | Status: AC | PRN
  Administered 2015-11-21: 4 mg via ORAL
  Filled 2015-11-21: qty 1

## 2015-11-21 MED ORDER — PROMETHAZINE HCL 25 MG PO TABS
25.0000 mg | ORAL_TABLET | Freq: Four times a day (QID) | ORAL | Status: DC | PRN
  Administered 2015-11-21: 25 mg via ORAL
  Filled 2015-11-21: qty 1

## 2015-11-21 MED ORDER — KETOROLAC TROMETHAMINE 10 MG PO TABS
10.0000 mg | ORAL_TABLET | Freq: Once | ORAL | Status: AC
  Administered 2015-11-21: 10 mg via ORAL
  Filled 2015-11-21: qty 1

## 2015-11-21 MED ORDER — LORAZEPAM 1 MG PO TABS
ORAL_TABLET | ORAL | Status: AC
  Administered 2015-11-21: 16:00:00
  Filled 2015-11-21: qty 1

## 2015-11-21 MED ORDER — CLONIDINE HCL 0.1 MG PO TABS
0.1000 mg | ORAL_TABLET | Freq: Once | ORAL | Status: AC
  Administered 2015-11-21: 0.1 mg via ORAL
  Filled 2015-11-21: qty 1

## 2015-11-21 NOTE — ED Notes (Addendum)
Patient vomitted. Medication in the emesis bag.

## 2015-11-21 NOTE — ED Notes (Signed)
Patient states that she is having hot and cold flashes, nose running and body aches, nurse did receive order for benadryl, nurse will continue to monitor, patient denies Si/hi or avh, states she has been on heroin for 2 years, Patient is very thin, and malnourished in appearance, q6115min. Checks, and camera surveillance in progress.

## 2015-11-21 NOTE — ED Provider Notes (Addendum)
-----------------------------------------   7:38 PM on 11/21/2015 -----------------------------------------  Specialist on call has seen the patient, they believe the patient psychiatrically cleared for discharge, they will reverse the IVC. They recommend opioid detox and rehabilitation program. TTS currently discussing the patient with RTS. Specialist on call has made IM medication recommendations however the patient has been doing well on oral medications, we'll continue with oral medications until the patient can be referred to an appropriate facility. As the IVC has been rescinded the patient is now here voluntarily.   Minna AntisKevin Akshar Starnes, MD 11/21/15 1939  ----------------------------------------- 9:12 PM on 11/21/2015 -----------------------------------------  Patient states she wishes to go home. Patient's parents are coming to pick her up, they've made arrangements for her to go to [place] drug detox facility in [place].   Minna AntisKevin Mackena Plummer, MD 11/21/15 2112

## 2015-11-21 NOTE — ED Notes (Signed)
Patient noted in room. No complaints, stable, in no acute distress. Q15 minute rounds and monitoring via Security Cameras to continue.  

## 2015-11-21 NOTE — ED Notes (Signed)
Patient is sleeping at this time, no signs of distress at this time, will continue to monitor, camera surveillance in progress.

## 2015-11-21 NOTE — ED Notes (Signed)
Patient with n/n, stomach cramps, anxiety, skin crawling, nurse received order for medication,s and administered, will continue to monitor. V/s received.

## 2015-11-21 NOTE — ED Notes (Signed)
Pts mother left phone number for contact :  [phone]

## 2015-11-21 NOTE — ED Notes (Signed)
Patient came to nursing station, asking for something for withdrawal, states "I can't take much more, I am about to go crazy, skin crawling, aching all over, nurse called MD and Dr. Inocencio HomesGayle ordered ativan 1 mg po.

## 2015-11-21 NOTE — ED Notes (Signed)
Soc in progress.  

## 2015-11-21 NOTE — ED Provider Notes (Signed)
[facility] Emergency Department Provider Note    First MD Initiated Contact with Patient 11/21/15 0017     (approximate)  I have reviewed the triage vital signs and the nursing notes.   HISTORY  Chief Complaint Psychiatric Evaluation    HPI Paula Hester is a 20 y.o. female with history of polysubstance abuse presents to the emergency department involuntarily committed. Patient admits to using IV heroin and cocaine last use 7 PM Thursday evening. Patient states while she was committed she would be willing to stay voluntarily to get the help that she needs. Patient denies any suicidal homicidal ideations. Patient states that she's been using heroin since the age of 20.  Past Medical History:  Diagnosis Date  . Anxiety   . Bipolar 1 disorder (HCC)   . Chronic pelvic pain in female 2013   [facility]  . Endometriosis   . PTSD (post-traumatic stress disorder)     Patient Active Problem List   Diagnosis Date Noted  . Labor and delivery, indication for care 09/10/2015  . Irregular contractions 09/08/2015  . Labor abnormal 09/08/2015  . History of cesarean delivery 09/08/2015    Past Surgical History:  Procedure Laterality Date  . CESAREAN SECTION    . CESAREAN SECTION N/A 09/08/2015   Procedure: CESAREAN SECTION;  Surgeon: Nadara Mustardobert P Harris, MD;  Location: [facility] ORS;  Service: Obstetrics;  Laterality: N/A;  . ovarian cyst removed      Prior to Admission medications   Medication Sig Start Date End Date Taking? Authorizing Provider  ibuprofen (ADVIL,MOTRIN) 600 MG tablet Take 1 tablet (600 mg total) by mouth every 6 (six) hours. 09/10/15   Chelsea C Ward, MD  oxyCODONE-acetaminophen (PERCOCET/ROXICET) 5-325 MG tablet Take 1-2 tablets by mouth every 4 (four) hours as needed for severe pain. 09/10/15   Elenora Fenderhelsea C Ward, MD    Allergies Naproxen; Oxcarbazepine; Hydrocodone; and Morphine and related  No family history on file.  Social History Social History    Substance Use Topics  . Smoking status: Current Every Day Smoker    Packs/day: 1.00    Years: 6.00    Types: Cigarettes  . Smokeless tobacco: Never Used  . Alcohol use Yes     Comment: occasionaly    Review of Systems Constitutional: No fever/chills Eyes: No visual changes. ENT: No sore throat. Cardiovascular: Denies chest pain. Respiratory: Denies shortness of breath. Gastrointestinal: Positive for abdominal cramps and vomiting Genitourinary: Negative for dysuria. Musculoskeletal: Negative for back pain. Skin: Negative for rash. Neurological: Negative for headaches, focal weakness or numbness. Psychiatric:Positive for polysubstance abuse  10-point ROS otherwise negative.  ____________________________________________   PHYSICAL EXAM:  VITAL SIGNS: ED Triage Vitals [11/20/15 2333]  Enc Vitals Group     BP 106/66     Pulse Rate 91     Resp 14     Temp 98.4 F (36.9 C)     Temp Source Oral     SpO2 99 %     Weight 90 lb (40.8 kg)     Height 5\' 2"  (1.575 m)     Head Circumference      Peak Flow      Pain Score 5     Pain Loc      Pain Edu?      Excl. in GC?     Constitutional: Alert and oriented. Well appearing and in no acute distress. Eyes: Conjunctivae are normal. PERRL. EOMI. Head: Atraumatic. Ears:  Healthy appearing ear canals and  TMs bilaterally Nose: No congestion/rhinnorhea. Mouth/Throat: Mucous membranes are moist.  Oropharynx non-erythematous. Neck: No stridor.  No meningeal signs.  No cervical spine tenderness to palpation. Cardiovascular: Normal rate, regular rhythm. Good peripheral circulation. Grossly normal heart sounds. Respiratory: Normal respiratory effort.  No retractions. Lungs CTAB. Gastrointestinal: Soft and nontender. No distention.  Musculoskeletal: No lower extremity tenderness nor edema. No gross deformities of extremities. Neurologic:  Normal speech and language. No gross focal neurologic deficits are appreciated.  Skin:  Skin  is warm, dry and intact. No rash noted. Psychiatric: Mood and affect are normal. Speech and behavior are normal.  ____________________________________________   LABS (all labs ordered are listed, but only abnormal results are displayed)  Labs Reviewed  COMPREHENSIVE METABOLIC PANEL - Abnormal; Notable for the following:       Result Value   Potassium 3.1 (*)    Total Protein 8.9 (*)    Albumin 5.1 (*)    AST 65 (*)    ALT 104 (*)    All other components within normal limits  URINE DRUG SCREEN, QUALITATIVE ([facility] ONLY) - Abnormal; Notable for the following:    Cocaine Metabolite,Ur [place] POSITIVE (*)    Opiate, Ur Screen POSITIVE (*)    Cannabinoid 50 Ng, Ur [place] POSITIVE (*)    Benzodiazepine, Ur Scrn POSITIVE (*)    All other components within normal limits  ETHANOL  CBC  POCT PREGNANCY, URINE     Procedures    INITIAL IMPRESSION / ASSESSMENT AND PLAN / ED COURSE  Pertinent labs & imaging results that were available during my care of the patient were reviewed by me and considered in my medical decision making (see chart for details).  Patient given Bentyl and Toradol   Clinical Course    ____________________________________________  FINAL CLINICAL IMPRESSION(S) / ED DIAGNOSES  Final diagnoses:  Polysubstance abuse     MEDICATIONS GIVEN DURING THIS VISIT:  Medications  ondansetron (ZOFRAN-ODT) disintegrating tablet 4 mg (4 mg Oral Given 11/21/15 0008)     NEW OUTPATIENT MEDICATIONS STARTED DURING THIS VISIT:  New Prescriptions   No medications on file    Modified Medications   No medications on file    Discontinued Medications   No medications on file     Note:  This document was prepared using Dragon voice recognition software and may include unintentional dictation errors.    Darci Current, MD 11/21/15 [phone]

## 2015-11-21 NOTE — ED Notes (Signed)
Patient received breakfast tray, she is oriented, no signs of distress, q 15 minutes checks, and camera surveillance in progress.

## 2015-11-21 NOTE — ED Notes (Signed)
Patient is sleeping , no signs of withdrawal at this time. Nurse to continue to monitor.

## 2015-11-21 NOTE — ED Notes (Signed)
Called SOC for consult [phone] 

## 2015-11-21 NOTE — ED Notes (Signed)
Patient with n/v , Patient was trying to sleep, nurse had to arouse her to talk with Doctor for consult. Patient went straight to bathroom, patient back in room.
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
## 2015-11-21 NOTE — ED Notes (Signed)
IVC status clarified with first nurse, [facility], pt does not have IVC papers

## 2015-11-21 NOTE — ED Notes (Signed)
Pt denies SI/HI/AVH. Pt given discharge instructions. Pt states understanding. Pt states receipt of all belongings.   

## 2015-11-21 NOTE — ED Notes (Signed)
Spoke to [place], TSS, concerning consult, waiting to see if psychiatrist will be in to round, if not then [facility] as [facility]

## 2016-06-04 ENCOUNTER — Emergency Department
Admission: EM | Admit: 2016-06-04 | Discharge: 2016-06-04 | Disposition: A | Discharge status: Home / Self Care | Payer: Medicaid Other | Attending: Emergency Medicine | Admitting: Emergency Medicine

## 2016-06-04 ENCOUNTER — Encounter: Payer: Self-pay | Admitting: Emergency Medicine

## 2016-06-04 DIAGNOSIS — L02413 Cutaneous abscess of right upper limb: Secondary | ICD-10-CM | POA: Diagnosis present

## 2016-06-04 DIAGNOSIS — F17210 Nicotine dependence, cigarettes, uncomplicated: Secondary | ICD-10-CM | POA: Diagnosis not present

## 2016-06-04 DIAGNOSIS — L03113 Cellulitis of right upper limb: Secondary | ICD-10-CM | POA: Diagnosis not present

## 2016-06-04 MED ORDER — LIDOCAINE-EPINEPHRINE 1 %-1:100000 IJ SOLN
20.0000 mL | Freq: Once | INTRAMUSCULAR | Status: DC
  Filled 2016-06-04: qty 20

## 2016-06-04 MED ORDER — LIDOCAINE-EPINEPHRINE 2 %-1:100000 IJ SOLN
INTRAMUSCULAR | Status: AC
  Filled 2016-06-04: qty 3.4

## 2016-06-04 MED ORDER — OXYCODONE-ACETAMINOPHEN 5-325 MG PO TABS
1.0000 | ORAL_TABLET | Freq: Once | ORAL | Status: AC
  Administered 2016-06-04: 1 via ORAL
  Filled 2016-06-04: qty 1

## 2016-06-04 MED ORDER — IBUPROFEN 800 MG PO TABS: 800.0000 mg | ORAL_TABLET | Freq: Three times a day (TID) | ORAL | 0 refills | Status: DC | PRN

## 2016-06-04 MED ORDER — SULFAMETHOXAZOLE-TRIMETHOPRIM 400-80 MG PO TABS: 2.0000 | ORAL_TABLET | Freq: Two times a day (BID) | ORAL | 0 refills | Status: AC

## 2016-06-04 NOTE — ED Notes (Signed)
Pt states "they already marked my arm and everything" when this nurse stated the area needs to be circled and dated.  Patient states in a hurry to catch her ride.

## 2016-06-04 NOTE — ED Notes (Signed)
Pt confirmed with this RN and MD Sharma Covertorman that she has a ride home. Medication administered after confirmation.

## 2016-06-04 NOTE — Discharge Instructions (Addendum)
Please take the entire course of antibiotics, even if you're feeling better. You may take Tylenol or Motrin for your pain.  Keep your arm elevated above the level of your heart as much as possible, and apply ice for 10 minutes every 2 hours to decrease pain and swelling.  Please have your roommate change your packing once daily as instructed.  Return to the emergency department if you develop worsening redness around your abscess, fever, vomiting, or any other symptoms concerning to you.

## 2016-06-04 NOTE — ED Triage Notes (Signed)
Patient arrives with wound to right forearm, states it has been worsening for 2 days. Patient reports it as an "insect bite"

## 2016-06-04 NOTE — ED Provider Notes (Addendum)
[facility] Emergency Department Provider Note  ____________________________________________  Time seen: Approximately 10:05 AM  I have reviewed the triage vital signs and the nursing notes.   HISTORY  Chief Complaint Wound Check    HPI Paula Hester is a 21 y.o. female , who is immunocompetent, presenting with abscess to the right forearm. The patient reports that since yesterday, she has noted a progressively worsening erythematous, severely tender "bump" in the mid right forearm spreading erythema. She has not had any systemic symptoms including nausea or vomiting, fever or chills. The patient does report injecting heroin, but "I've been clean for 3 weeks and that is not my spot because I'm right-handed."   Past Medical History:  Diagnosis Date  . Anxiety   . Bipolar 1 disorder (HCC)   . Chronic pelvic pain in female 2013   [facility]  . Endometriosis   . PTSD (post-traumatic stress disorder)     Patient Active Problem List   Diagnosis Date Noted  . Labor and delivery, indication for care 09/10/2015  . Irregular contractions 09/08/2015  . Labor abnormal 09/08/2015  . History of cesarean delivery 09/08/2015    Past Surgical History:  Procedure Laterality Date  . CESAREAN SECTION    . CESAREAN SECTION N/A 09/08/2015   Procedure: CESAREAN SECTION;  Surgeon: Nadara Mustard, MD;  Location: [facility] ORS;  Service: Obstetrics;  Laterality: N/A;  . ovarian cyst removed      Current Outpatient Rx  . Order #: [id number] Class: Print  . Order #: [id number] Class: Print  . Order #: [id number] Class: Print    Allergies Naproxen; Oxcarbazepine; Hydrocodone; and Morphine and related  History reviewed. No pertinent family history.  Social History Social History  Substance Use Topics  . Smoking status: Current Every Day Smoker    Packs/day: 1.00    Years: 6.00    Types: Cigarettes  . Smokeless tobacco: Never Used  . Alcohol use Yes     Comment: occasionaly     Review of Systems Constitutional: No fever/chills.No lightheadedness or syncope. Eyes: No eye discharge. ENT:  No congestion or rhinorrhea. Respiratory: Denies shortness of breath.   Gastrointestinal: No nausea, no vomiting.  No diarrhea.  No constipation. Musculoskeletal: Negative for back pain. Skin: Positive for abscess to the right forearm. Neurological: Negative for headaches. No focal numbness, tingling or weakness.   10-point ROS otherwise negative.  ____________________________________________   PHYSICAL EXAM:  VITAL SIGNS: ED Triage Vitals  Enc Vitals Group     BP 06/04/16 0945 106/69     Pulse Rate 06/04/16 0945 99     Resp 06/04/16 0945 17     Temp 06/04/16 0945 98.8 F (37.1 C)     Temp Source 06/04/16 0945 Oral     SpO2 06/04/16 0945 100 %     Weight 06/04/16 0945 100 lb (45.4 kg)     Height 06/04/16 0945 5\' 2"  (1.575 m)     Head Circumference --      Peak Flow --      Pain Score 06/04/16 0941 7     Pain Loc --      Pain Edu? --      Excl. in GC? --     Constitutional: Alert and oriented. Well appearing and in no acute distress. Answers questions appropriately. Eyes: Conjunctivae are normal.  EOMI. No scleral icterus. Head: Atraumatic. Nose: No congestion/rhinnorhea. Mouth/Throat: Mucous membranes are moist.  Neck: No stridor.  Supple.   Cardiovascular: Normal rate Respiratory: Normal respiratory  effort.  No accessory muscle use or retractions.  Musculoskeletal: Right forearm has a 3 x 3 cm circular raised fluctuant and significantly tender mass in the mid forearm with associated swelling and erythema. The erythema extends to 8 x 6 cm. Full range of motion without pain of the right elbow, wrist and shoulder. Normal right radial pulse. Neurologic:  A&Ox3.  Speech is clear.  Face and smile are symmetric.  EOMI.  Moves all extremities well. Skin:  Skin is warm, dry. See above Psychiatric: Mood and affect are normal. Speech and behavior are normal.   Normal judgement.  ____________________________________________   LABS (all labs ordered are listed, but only abnormal results are displayed)  Labs Reviewed - No data to display ____________________________________________  EKG  Not indicated ____________________________________________  RADIOLOGY  No results found.  ____________________________________________   PROCEDURES  Procedure(s) performed: None  Procedures  Critical Care performed: No ____________________________________________   INITIAL IMPRESSION / ASSESSMENT AND PLAN / ED COURSE  Pertinent labs & imaging results that were available during my care of the patient were reviewed by me and considered in my medical decision making (see chart for details).  21 y.o. female without a history of abscess, immunocompetent, presenting with acute onset of right forearm abscess with surrounding cellulitis. At this time, the patient is no evidence of systemic symptoms. I'll plan incision and drainage, packing as needed, and discharge on antibiotics for the patient cellulitis. Return precautions as well as follow-up instructions were discussed.  INCISION AND DRAINAGE Performed by: Rockne MenghiniNorman, Anne-Caroline Consent: Verbal consent obtained. Risks and benefits: risks, benefits and alternatives were discussed Type: abscess  Body area: R forearm  Anesthesia: local infiltration  Incision was made with a scalpel.  Local anesthetic: lidocaine 2% with epinephrine  Anesthetic total: 2.5 ml  Complexity: complex Blunt dissection to break up loculations  Drainage: purulent  Drainage amount: 4mL  Packing material: 1/4 in iodoform gauze  Patient tolerance: Patient tolerated the procedure well with no immediate complications.  Area of cellulitis demarcated with skin pen.     ____________________________________________  FINAL CLINICAL IMPRESSION(S) / ED DIAGNOSES  Final diagnoses:  Abscess of forearm, right   Right forearm cellulitis         NEW MEDICATIONS STARTED DURING THIS VISIT:  New Prescriptions   IBUPROFEN (ADVIL,MOTRIN) 800 MG TABLET    Take 1 tablet (800 mg total) by mouth every 8 (eight) hours as needed (with food).   SULFAMETHOXAZOLE-TRIMETHOPRIM (BACTRIM) 400-80 MG TABLET    Take 2 tablets by mouth 2 (two) times daily.      Rockne MenghiniAnne-Caroline Robbert Langlinais, MD 06/04/16 1011    Rockne MenghiniAnne-Caroline Monteen Toops, MD 06/04/16 1041

## 2016-09-18 IMAGING — CT CT ABD-PELV W/O CM
2 of 4 series · 16 of 46 positions shown, 18 images · non-contrast
Comparison: 02/17/2014

CLINICAL DATA: Right flank pain for 3 days

EXAM:
CT ABDOMEN AND PELVIS WITHOUT CONTRAST
TECHNIQUE: Multidetector CT imaging of the abdomen and pelvis was performed
following the standard protocol without IV contrast.

[Series 2: stone standard full · axial · 0.56mm/px · z∈[-484,-60]mm · 13 of 93 slices shown, 15 images]
[im 4/93  soft-tissue]
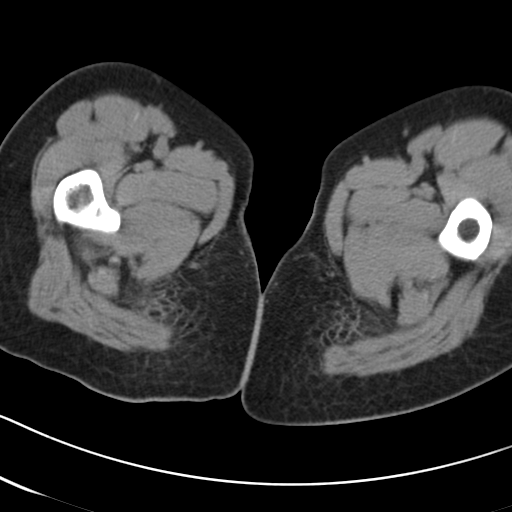
[im 4/93  bone]
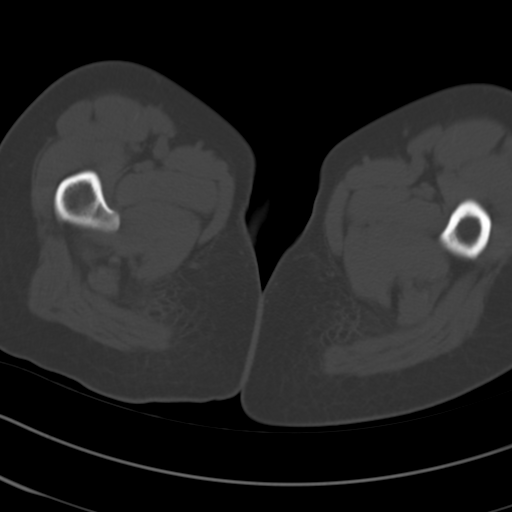
[im 11/93  soft-tissue]
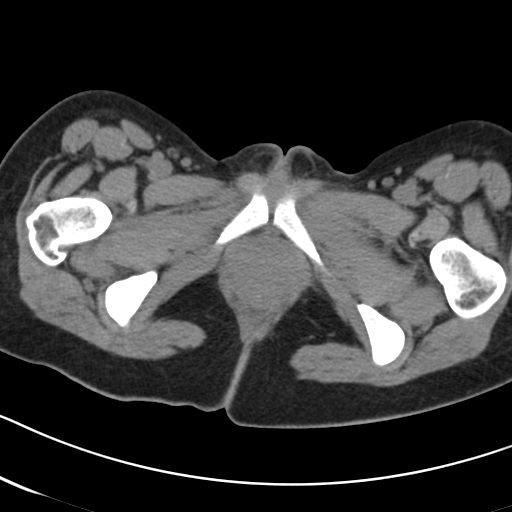
[im 18/93  soft-tissue]
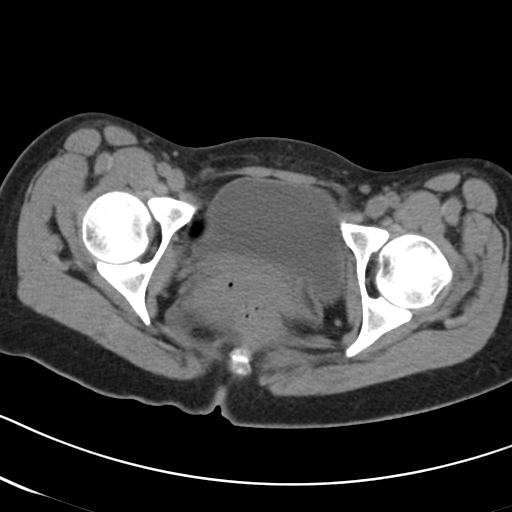
[im 25/93  soft-tissue]
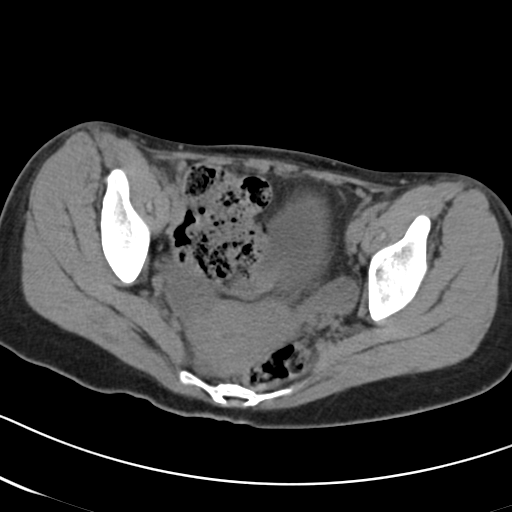
[im 32/93  soft-tissue]
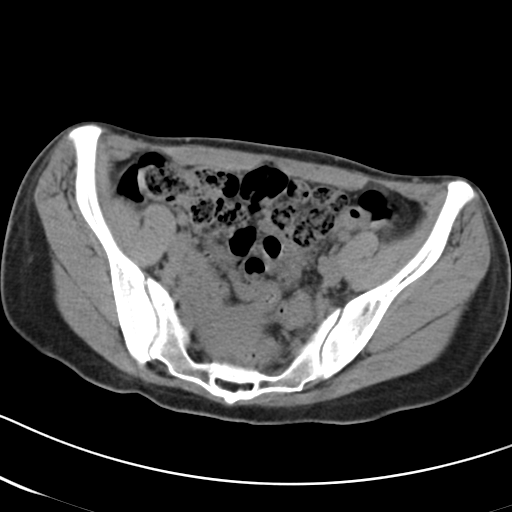
[im 39/93  soft-tissue]
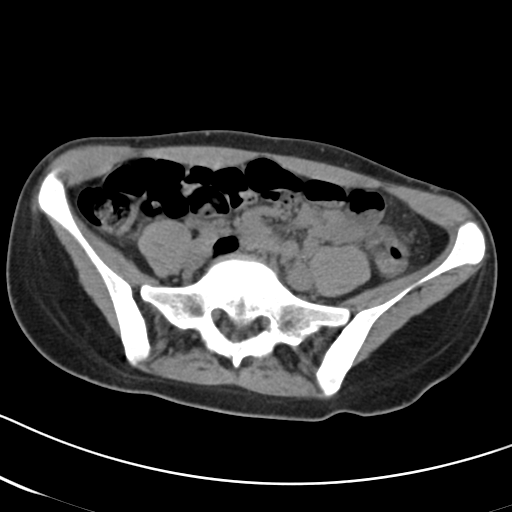
[im 47/93  soft-tissue]
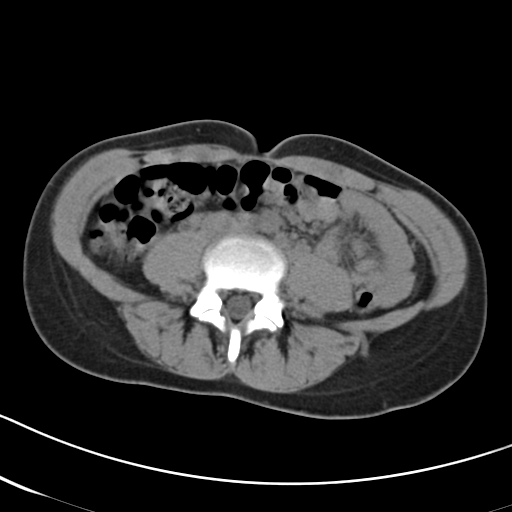
[im 54/93  soft-tissue]
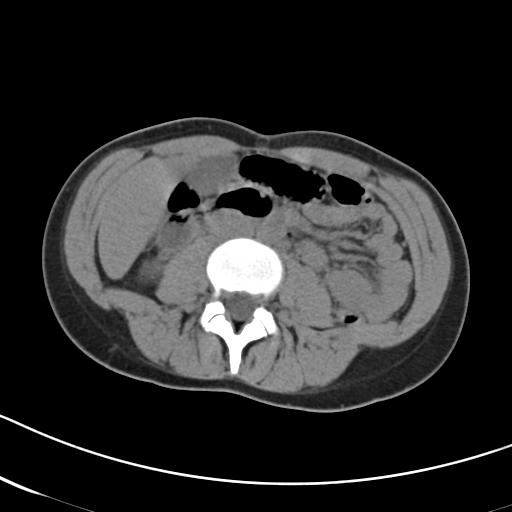
[im 61/93  soft-tissue]
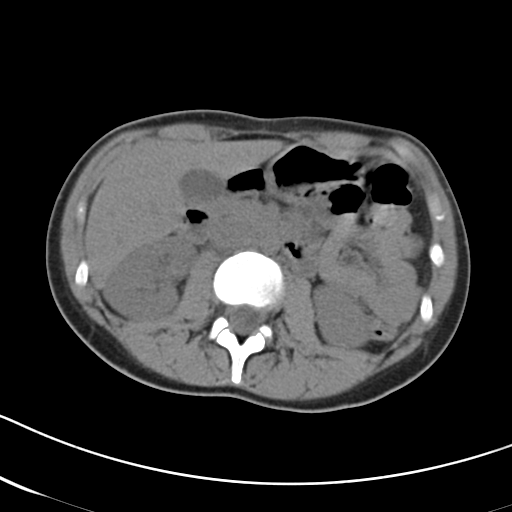
[im 61/93  bone]
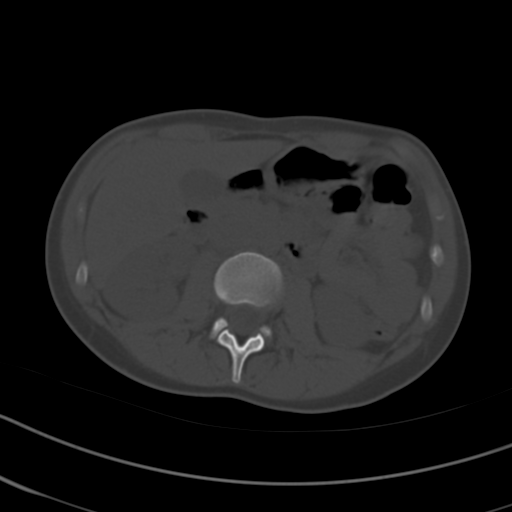
[im 68/93  soft-tissue]
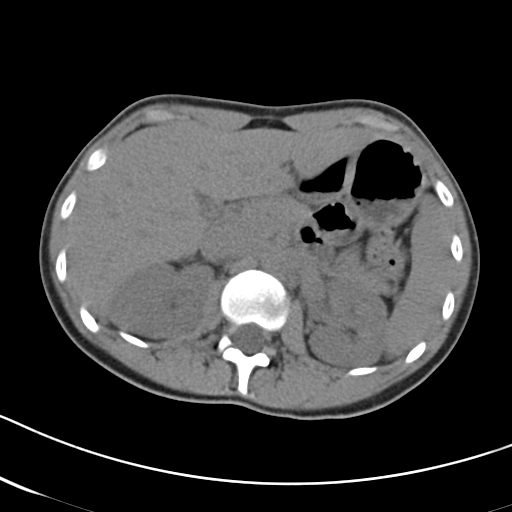
[im 75/93  soft-tissue]
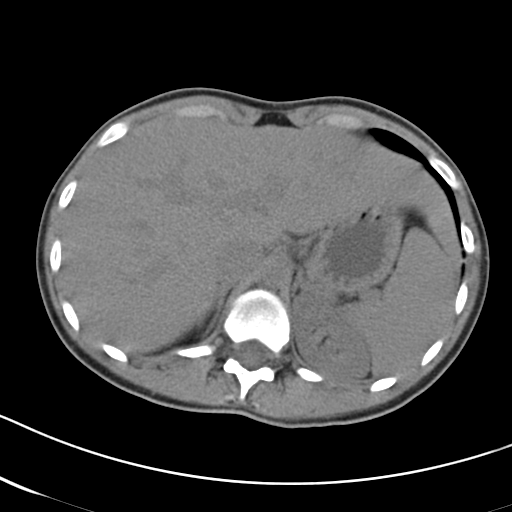
[im 82/93  soft-tissue]
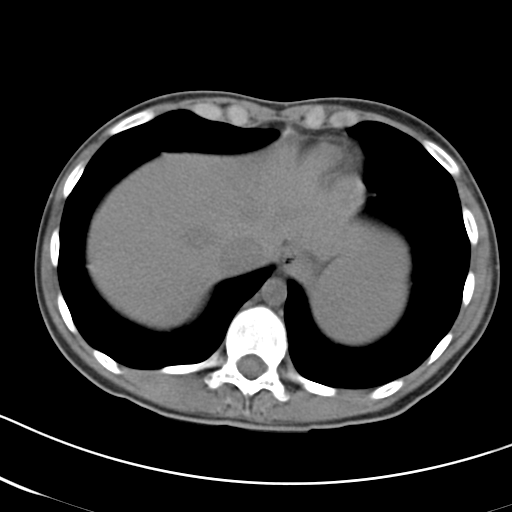
[im 89/93  soft-tissue]
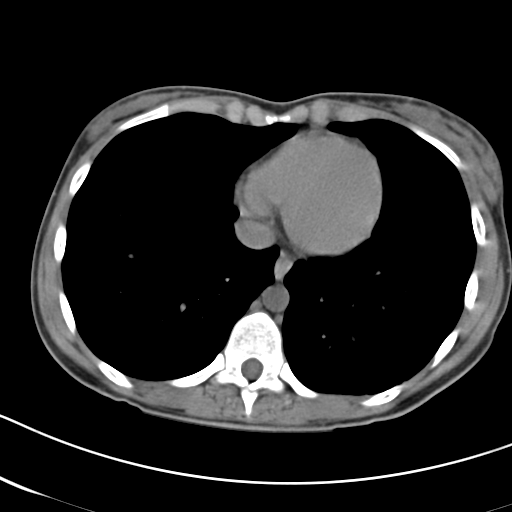

[Series 5: cor stone standard full · coronal · 0.69mm/px · 3 of 98 slices shown]
[im 33/98  soft-tissue]
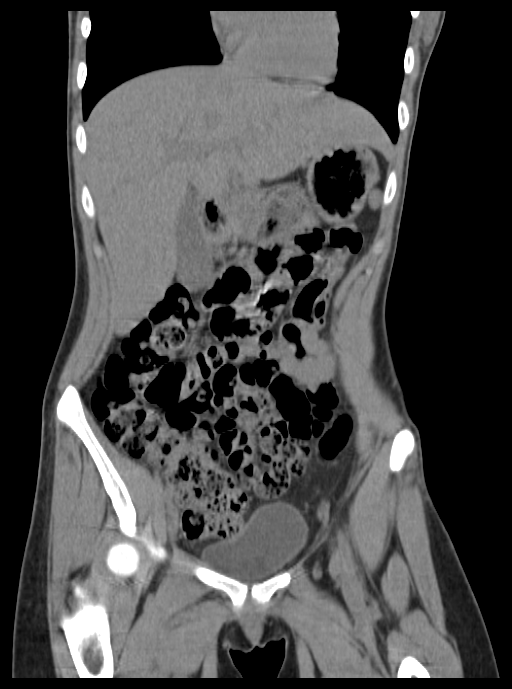
[im 44/98  soft-tissue]
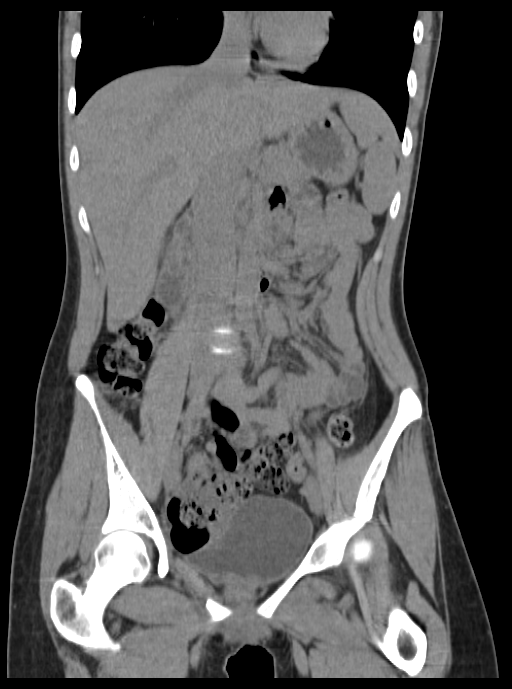
[im 54/98  soft-tissue]
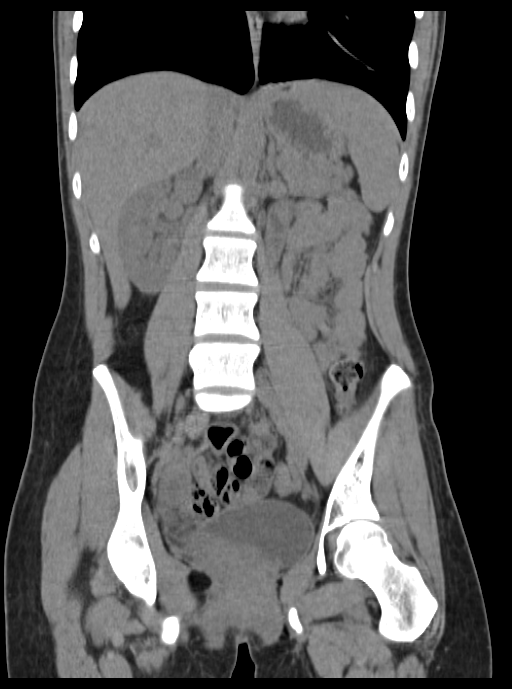

[16 of 46 positions shown; findings below may reference images not displayed]

FINDINGS: Lung bases are unremarkable. Sagittal images of the spine are
unremarkable.

Unenhanced liver shows no biliary ductal dilatation. No calcified
gallstones are noted within gallbladder. Unenhanced pancreas, spleen
and adrenal glands are unremarkable. Unenhanced kidneys are
symmetrical in size. No hydronephrosis or hydroureter. There is
nonobstructive calculus in midpole of the left kidney measures
mm.

No calcified ureteral calculi are noted bilaterally. No small bowel
obstruction. No free abdominal air. No adenopathy.

There is a low lying cecum. Moderate stool noted within cecum. No
pericecal inflammation. The tip of the cecum is in right pelvis with
mild mass effect on upper urinary bladder. Normal appendix is
partially visualized in coronal image 51.

Retroflexed uterus. There is a right ovarian cyst measures 2.7 cm.
Trace pelvic free fluid noted within posterior cul-de-sac. No
calcified calculi are noted within urinary bladder. The left ovary
is unremarkable.
IMPRESSION: 1. No hydronephrosis or hydroureter. There is left nonobstructive
nephrolithiasis.
2. No calcified ureteral calculi.
3. There is a low lying cecum. No pericecal inflammation. Normal
appendix is partially visualized.
4. Retroflexed uterus. There is a right ovarian simple cyst measures
2.7 cm. Trace free fluid noted posterior cul-de-sac.
5. No calcified calculi are noted within urinary bladder.

## 2016-10-30 ENCOUNTER — Encounter: Payer: Self-pay | Admitting: Emergency Medicine

## 2016-10-30 ENCOUNTER — Emergency Department: Payer: Medicaid Other

## 2016-10-30 ENCOUNTER — Emergency Department
Admission: EM | Admit: 2016-10-30 | Discharge: 2016-10-30 | Discharge status: Against Medical Advice | Payer: Medicaid Other | Attending: Emergency Medicine | Admitting: Emergency Medicine

## 2016-10-30 DIAGNOSIS — F17210 Nicotine dependence, cigarettes, uncomplicated: Secondary | ICD-10-CM | POA: Insufficient documentation

## 2016-10-30 DIAGNOSIS — R509 Fever, unspecified: Secondary | ICD-10-CM | POA: Insufficient documentation

## 2016-10-30 DIAGNOSIS — R202 Paresthesia of skin: Secondary | ICD-10-CM

## 2016-10-30 DIAGNOSIS — R200 Anesthesia of skin: Secondary | ICD-10-CM | POA: Diagnosis present

## 2016-10-30 DIAGNOSIS — R531 Weakness: Secondary | ICD-10-CM | POA: Diagnosis not present

## 2016-10-30 DIAGNOSIS — R110 Nausea: Secondary | ICD-10-CM | POA: Diagnosis not present

## 2016-10-30 HISTORY — DX: Major depressive disorder, single episode, unspecified: F32.9

## 2016-10-30 HISTORY — DX: Depression, unspecified: F32.A

## 2016-10-30 MED ORDER — LORAZEPAM 2 MG/ML IJ SOLN: 2.0000 mg | Freq: Once | INTRAMUSCULAR | Status: DC

## 2016-10-30 MED ORDER — LORAZEPAM 2 MG/ML IJ SOLN
1.0000 mg | Freq: Once | INTRAMUSCULAR | Status: AC
  Administered 2016-10-30: 1 mg via INTRAMUSCULAR
  Filled 2016-10-30: qty 1

## 2016-10-30 MED ORDER — LORAZEPAM 2 MG/ML IJ SOLN: 1.0000 mg | Freq: Once | INTRAMUSCULAR | Status: DC

## 2016-10-30 NOTE — ED Notes (Signed)
Pt. States she has been feeling weak for the past two days.  Pt. States she is unable to move left arm, but patient has been moving lt. Arm.   Pt. Is non-specific about numbness to lt. Side of body.  Pt. States it started with lt. Eye.  Pt. States no difference in vision between lt. And rt. Eye.  Pt. Just states "I sometimes see spots".  Pt. States symptoms of numbness to left side started around 2 am today.

## 2016-10-30 NOTE — ED Triage Notes (Signed)
Patient states that she has been feeling weak times two days and started vomiting yesterday. Patient states that last night before she went to bed that she started having numbness to her left upper thigh. Patient states that when she woke up this morning she was not able to feel anything on her left arm except her thumb and first finger. Patient states that now her tongue is going numb. Patient falling asleep in triage.

## 2016-10-30 NOTE — ED Notes (Signed)
Pt. States "I am on suboxone and I have been smoking weed tonight."  Pt. Denies any other drug use.

## 2016-10-30 NOTE — ED Notes (Signed)
Pt. States she wants to leave.  Pt. States she does not want any help.  Pt. Asked to stay, multiple times.  Pt. Continues to say "I want to leave, you can't make me stay".

## 2016-10-30 NOTE — ED Notes (Signed)
Patient yelling and cursing while this nurse attempting to start and IV. Patient states "i dont have a lot of veins because I used IV drugs" unable to start IV and patient makes a statement about not being a "GodDa** pin cushion". Explained to patient that we were having a difficult time starting an IV due to limited venous access. Patient states "i hate this damn hospital" "i hate coming up here" "i do not want smart ass comments" "i dont have to be here" "ill walk out" I explained to patient that we want to be able to help her and the plan of care that was explained by EDP. Patient states understanding.

## 2016-10-30 NOTE — ED Notes (Signed)
Pt. Repeatedly stated to this nurse and doctor in room that she did not want to stay and stated if symptoms returned she would go to her primary doctor or return here.  Patient boyfriend in room agreed to take patient home.

## 2016-10-30 NOTE — ED Notes (Signed)
Patient to stat desk via wheelchair by EMS.  Patient reports started with numbness to left thigh that started several hours ago.  Patient now reports entire left side is numb.  Patient also reports fever and vomiting for several days.

## 2016-10-30 NOTE — ED Provider Notes (Signed)
[facility] Emergency Department Provider Note   ____________________________________________   First MD Initiated Contact with Patient 10/30/16 [phone]     (approximate)  I have reviewed the triage vital signs and the nursing notes.   HISTORY  Chief Complaint Numbness; Weakness; and Emesis    HPI Paula Hester is a 21 y.o. female whocomes into the hospital today with some numbness. I walked into the room and the patient was asleep. When she woke up she stated that her tongue was numb. She reports that she found a tick on the top of her butt crack and she pulled it off. She reports that she started feeling paralyze from her left knee up to her hip. She reports that she had initially found to take a few hours ago but she went to sleep. She reports that she's had some fever and nausea and felt weak for the past 2 days and she has not been sleeping well. The patient states that she had taken her nighttime medicine and went to bed. When she woke up and tried to turn the light on she reports that she could not move. Initially said it was her entire body and then she stated it was the left side. She reports that she has some headache, chest pain, shortness of breath, nausea and vomiting. She vomited twice and she reports it was chunks with red. The patient contacted the advice line and was instructed to come into the hospital for evaluation. The patient is here to be seen and checked out.   Past Medical History:  Diagnosis Date  . Anxiety   . Bipolar 1 disorder (HCC)   . Chronic pelvic pain in female 2013   [facility]  . Depression   . Endometriosis   . PTSD (post-traumatic stress disorder)     Patient Active Problem List   Diagnosis Date Noted  . Labor and delivery, indication for care 09/10/2015  . Irregular contractions 09/08/2015  . Labor abnormal 09/08/2015  . History of cesarean delivery 09/08/2015    Past Surgical History:  Procedure Laterality Date  .  CESAREAN SECTION    . CESAREAN SECTION N/A 09/08/2015   Procedure: CESAREAN SECTION;  Surgeon: Nadara Mustard, MD;  Location: [facility] ORS;  Service: Obstetrics;  Laterality: N/A;  . ovarian cyst removed      Prior to Admission medications   Not on File    Allergies Naproxen; Oxcarbazepine; Hydrocodone; and Morphine and related  No family history on file.  Social History Social History  Substance Use Topics  . Smoking status: Current Every Day Smoker    Packs/day: 1.00    Years: 6.00    Types: Cigarettes  . Smokeless tobacco: Never Used  . Alcohol use Yes    Review of Systems  Constitutional: fever/chills Eyes: No visual changes. ENT: No sore throat. Cardiovascular: chest pain. Respiratory:  shortness of breath. Gastrointestinal: Nausea and vomiting with No abdominal pain.  No diarrhea.  No constipation. Genitourinary: Negative for dysuria. Musculoskeletal: Negative for back pain. Skin: Negative for rash. Neurological: Headache, left-sided weakness and numbness and tongue numbness   ____________________________________________   PHYSICAL EXAM:  VITAL SIGNS: ED Triage Vitals  Enc Vitals Group     BP 10/30/16 0528 95/61     Pulse Rate 10/30/16 0528 96     Resp 10/30/16 0528 18     Temp 10/30/16 0528 97.8 F (36.6 C)     Temp Source 10/30/16 0528 Oral     SpO2  10/30/16 0528 97 %     Weight 10/30/16 0532 83 lb 9.6 oz (37.9 kg)     Height 10/30/16 0532 5\' 2"  (1.575 m)     Head Circumference --      Peak Flow --      Pain Score --      Pain Loc --      Pain Edu? --      Excl. in GC? --     Constitutional: Somnolent but arousable Well appearing and in Mild distress. Eyes: Conjunctivae are normal. PERRL. EOMI. Head: Atraumatic. Nose: No congestion/rhinnorhea. Mouth/Throat: Mucous membranes are moist.  Oropharynx non-erythematous. Cardiovascular: Normal rate, regular rhythm. Grossly normal heart sounds.  Good peripheral circulation. Respiratory: Normal  respiratory effort.  No retractions. Lungs CTAB. Gastrointestinal: Soft and nontender. No distention. Positive bowel sounds Musculoskeletal: No lower extremity tenderness nor edema.   Neurologic:  Normal speech and language. Patient falling asleep while I ask her questions, patient states unable to move all of body with minimal effort during strength exam. Cranial nerves II through XII are grossly intact Skin:  Skin is warm, dry and intact.  Psychiatric: Mood and affect are normal.   ____________________________________________   LABS (all labs ordered are listed, but only abnormal results are displayed)  Labs Reviewed  CBC  COMPREHENSIVE METABOLIC PANEL  LIPASE, BLOOD  ETHANOL  URINE DRUG SCREEN, QUALITATIVE ([facility] ONLY)  URINALYSIS, COMPLETE ([facility]MP) WITH MICROSCOPIC  PREGNANCY, URINE   ____________________________________________  EKG  ED ECG REPORT I, Rebecka Apley, the attending physician, personally viewed and interpreted this ECG.   Date: 10/30/2016  EKG Time: 0547  Rate: 74  Rhythm: normal sinus rhythm  Axis: normal  Intervals:none  ST&T Change: none  ____________________________________________  RADIOLOGY  No results found.  ____________________________________________   PROCEDURES  Procedure(s) performed: None  Procedures  Critical Care performed: No  ____________________________________________   INITIAL IMPRESSION / ASSESSMENT AND PLAN / ED COURSE  Pertinent labs & imaging results that were available during my care of the patient were reviewed by me and considered in my medical decision making (see chart for details).  This is a 21 year old who comes into the hospital today with some left-sided numbness and weakness. When I walked into the room to initially evaluate the patient she was falling asleep during my history. Partly during the history the patient closed her eyes and started jerking up against the bed. I called nurses into the room  with a concern that the patient may have been having a seizure. After approximately 3 minutes the patient stop shaking and open her eyes with her mental status fully intact. The patient had no postictal period and no new acute neurologic deficit. 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 I also ordered some blood work and a CT scan on the patient. I did give the patient a dose of Ativan with a concern for possible seizure given her shaking episode. She did receive Ativan intramuscularly. After trying a few attempts to obtain an IV line as well as some urine the patient reports that she does not one appear anymore. She reports that she is not being treated well and she is asking to be discharged. I did inform the patient that she did come back quickly and we are trying to work to get her blood to evaluate her. The patient reports that if she needs anything she'll call her  doctor to follow up. I did discuss with the patient that if she were to leave we could not fully evaluate her and we would not know that if she is having a stroke. I informed the patient that she is taking the risk of leaving as if she is having a stroke it would not be evaluated quickly or discovered as quickly if she left. The patient reports that she does not like it here and she wants to leave anyway. She understands the risks of leaving the emergency department. The patient will be discharged from the hospital AGAINST MEDICAL ADVICE. She reports that she will return with worsening symptoms or follow up at another hospital.      ____________________________________________   FINAL CLINICAL IMPRESSION(S) / ED DIAGNOSES  Final diagnoses:  Weakness  Paresthesia      NEW MEDICATIONS STARTED DURING THIS VISIT:  There are no discharge medications for this patient.    Note:  This document was prepared using Dragon voice recognition software and may include  unintentional dictation errors.    Rebecka Apley, MD 10/30/16 [phone]

## 2018-07-04 ENCOUNTER — Encounter: Payer: Self-pay | Admitting: Emergency Medicine

## 2018-07-04 ENCOUNTER — Other Ambulatory Visit: Payer: Self-pay

## 2018-07-04 ENCOUNTER — Ambulatory Visit
Admission: EM | Admit: 2018-07-04 | Discharge: 2018-07-04 | Disposition: A | Discharge status: Home / Self Care | Payer: Medicaid Other | Attending: Family Medicine | Admitting: Family Medicine

## 2018-07-04 DIAGNOSIS — L509 Urticaria, unspecified: Secondary | ICD-10-CM

## 2018-07-04 DIAGNOSIS — K0889 Other specified disorders of teeth and supporting structures: Secondary | ICD-10-CM | POA: Diagnosis not present

## 2018-07-04 DIAGNOSIS — K029 Dental caries, unspecified: Secondary | ICD-10-CM

## 2018-07-04 MED ORDER — LIDOCAINE VISCOUS HCL 2 % MT SOLN: OROMUCOSAL | 0 refills | Status: DC

## 2018-07-04 MED ORDER — PENICILLIN V POTASSIUM 500 MG PO TABS: 500.0000 mg | ORAL_TABLET | Freq: Three times a day (TID) | ORAL | 0 refills | Status: DC

## 2018-07-04 NOTE — ED Provider Notes (Signed)
[facility]    CSN: [id number] Arrival date & time: 07/04/18  1439     History   Chief Complaint Chief Complaint  Patient presents with  . Dental Pain  . Rash    HPI Paula Hester is a 23 y.o. female.   23 yo female with a c/o left lower molar pain for the past month. States she pulled her own tooth out. Also c/o bed bug bites and an itchy rash for the past month as well.   The history is provided by the patient.  Dental Pain  Rash    Past Medical History:  Diagnosis Date  . Anxiety   . Bipolar 1 disorder (HCC)   . Chronic pelvic pain in female 2013   [facility]  . Depression   . Endometriosis   . PTSD (post-traumatic stress disorder)     Patient Active Problem List   Diagnosis Date Noted  . Labor and delivery, indication for care 09/10/2015  . Irregular contractions 09/08/2015  . Labor abnormal 09/08/2015  . History of cesarean delivery 09/08/2015    Past Surgical History:  Procedure Laterality Date  . CESAREAN SECTION    . CESAREAN SECTION N/A 09/08/2015   Procedure: CESAREAN SECTION;  Surgeon: Nadara Mustard, MD;  Location: [facility] ORS;  Service: Obstetrics;  Laterality: N/A;  . ovarian cyst removed      OB History    Gravida  2   Para  1   Term  1   Preterm      AB      Living  1     SAB      TAB      Ectopic      Multiple  0   Live Births  1            Home Medications    Prior to Admission medications   Medication Sig Start Date End Date Taking? Authorizing Provider  lidocaine (XYLOCAINE) 2 % solution 10 ml apply to affected area q 6 hours prn 07/04/18   Payton Mccallum, MD  penicillin v potassium (VEETID) 500 MG tablet Take 1 tablet (500 mg total) by mouth 3 (three) times daily. 07/04/18   Payton Mccallum, MD    Family History History reviewed. No pertinent family history.  Social History Social History   Tobacco Use  . Smoking status: Current Every Day Smoker    Packs/day: 1.00    Years: 6.00    Pack years:  6.00    Types: Cigarettes  . Smokeless tobacco: Never Used  Substance Use Topics  . Alcohol use: Yes  . Drug use: Yes    Types: IV, Marijuana    Comment: heroin-states last use as a "long time ago"     Allergies   Naproxen; Oxcarbazepine; Hydrocodone; and Morphine and related   Review of Systems Review of Systems  Skin: Positive for rash.     Physical Exam Triage Vital Signs ED Triage Vitals [07/04/18 1451]  Enc Vitals Group     BP 112/80     Pulse Rate (!) 111     Resp 18     Temp 98.3 F (36.8 C)     Temp Source Oral     SpO2 98 %     Weight 96 lb (43.5 kg)     Height 5\' 2"  (1.575 m)     Head Circumference      Peak Flow      Pain Score 10  Pain Loc      Pain Edu?      Excl. in GC?    No data found.  Updated Vital Signs BP 112/80 (BP Location: Left Arm)   Pulse (!) 111   Temp 98.3 F (36.8 C) (Oral)   Resp 18   Ht 5\' 2"  (1.575 m)   Wt 43.5 kg   SpO2 98%   BMI 17.56 kg/m   Visual Acuity Right Eye Distance:   Left Eye Distance:   Bilateral Distance:    Right Eye Near:   Left Eye Near:    Bilateral Near:     Physical Exam Vitals signs and nursing note reviewed.  Constitutional:      General: She is not in acute distress.    Appearance: She is not toxic-appearing or diaphoretic.  HENT:     Mouth/Throat:     Dentition: Abnormal dentition. Dental caries present.     Pharynx: Oropharynx is clear.     Tonsils: No tonsillar exudate or tonsillar abscesses.  Skin:    Findings: Rash present. Rash is urticarial.       Neurological:     Mental Status: She is alert.      UC Treatments / Results  Labs (all labs ordered are listed, but only abnormal results are displayed) Labs Reviewed - No data to display  EKG None  Radiology No results found.  Procedures Procedures (including critical care time)  Medications Ordered in UC Medications - No data to display  Initial Impression / Assessment and Plan / UC Course  I have reviewed  the triage vital signs and the nursing notes.  Pertinent labs & imaging results that were available during my care of the patient were reviewed by me and considered in my medical decision making (see chart for details).      Final Clinical Impressions(s) / UC Diagnoses   Final diagnoses:  Pain, dental  Dental caries  Urticaria    ED Prescriptions    Medication Sig Dispense Auth. Provider   penicillin v potassium (VEETID) 500 MG tablet Take 1 tablet (500 mg total) by mouth 3 (three) times daily. 30 tablet Ritu Gagliardo, Pamala Hurryrlando, MD   lidocaine (XYLOCAINE) 2 % solution 10 ml apply to affected area q 6 hours prn 100 mL Gaile Allmon, Pamala Hurryrlando, MD      1. diagnosis reviewed with patient 2. rx as per orders above; reviewed possible side effects, interactions, risks and benefits  3. Recommend supportive treatment with otc antihistamines prn 4. Follow-up prn if symptoms worsen or don't improve Controlled Substance Prescriptions [place] Controlled Substance Registry consulted? Not Applicable   Payton Mccallumonty, Blimie Vaness, MD 07/04/18 [phone]

## 2018-07-04 NOTE — ED Triage Notes (Signed)
Patient c/o tooth pain that started 1 month ago. She states she pulled her own tooth out and has had pain since then. Patient also c/o bed bug bites a few months ago. She states she still has a rash on her abdomen and back that will not go away.

## 2019-10-29 ENCOUNTER — Telehealth: Payer: Self-pay

## 2019-10-29 NOTE — Telephone Encounter (Signed)
Per TC from Frederica Kuster at the [facility], patient has a new OB appointment on 10/31/19 and is likely unaware. Per Darl Pikes, pregnancy test results and drug screen have been faxed to [facility], and she states patient unsure how far along she is. TC to patient to remind her of appointment on 10/31/19, needs to arrive 8am for all preadmission paperwork. Unable to leave message on cell phone, left message with number to call at home phone number.Burt Knack, RN

## 2019-11-14 ENCOUNTER — Ambulatory Visit: Payer: Medicaid Other | Admitting: Physician Assistant

## 2019-11-14 ENCOUNTER — Encounter: Payer: Self-pay | Admitting: Physician Assistant

## 2019-11-14 ENCOUNTER — Other Ambulatory Visit: Payer: Self-pay

## 2019-11-14 DIAGNOSIS — Z202 Contact with and (suspected) exposure to infections with a predominantly sexual mode of transmission: Secondary | ICD-10-CM

## 2019-11-14 DIAGNOSIS — Z113 Encounter for screening for infections with a predominantly sexual mode of transmission: Secondary | ICD-10-CM | POA: Diagnosis not present

## 2019-11-14 MED ORDER — PENICILLIN G BENZATHINE 1200000 UNIT/2ML IM SUSP
2.4000 10*6.[IU] | INTRAMUSCULAR | Status: AC
  Administered 2019-11-14 – 2019-11-29 (×3): 2.4 10*6.[IU] via INTRAMUSCULAR

## 2019-11-14 NOTE — Progress Notes (Signed)
  [facility] Department STI clinic/screening visit  Subjective:  Paula Hester is a 24 y.o. female being seen today for an STI screening visit. The patient reports they do not have symptoms.  Patient reports that they are currently pregnant.   They reported they are not interested in discussing contraception today.  No LMP recorded. Patient is pregnant.   Patient has the following medical conditions:   Patient Active Problem List   Diagnosis Date Noted  . Irregular contractions 09/08/2015  . Labor abnormal 09/08/2015  . History of cesarean delivery 09/08/2015  . Asthma 01/17/2013  . Anxiety 01/17/2013  . Bipolar disorder (HCC) 01/17/2013  . Endometriosis 01/17/2013    Chief Complaint  Patient presents with  . SEXUALLY TRANSMITTED DISEASE    screening    HPI  Patient reports that she is here to get treated for Syphilis.  States that she is pregnant and has an appointment on the 19th to start [facility].  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  Reports that she was last tested for HIV about 1 month ago and is not sure when she had last pap.   See flowsheet for further details and programmatic requirements.    The following portions of the patient's history were reviewed and updated as appropriate: allergies, current medications, past medical history, past social history, past surgical history and problem list.  Objective:  There were no vitals filed for this visit.  Physical Exam Constitutional:      General: She is not in acute distress.    Appearance: Normal appearance.  HENT:     Head: Normocephalic and atraumatic.  Eyes:     Conjunctiva/sclera: Conjunctivae normal.  Pulmonary:     Effort: Pulmonary effort is normal.  Neurological:     Mental Status: She is alert and oriented to person, place, and time.  Psychiatric:        Mood and Affect: Mood normal.        Behavior: Behavior normal.        Thought Content: Thought content  normal.        Judgment: Judgment normal.      Assessment and Plan:  TANGANYIKA BOWLDS is a 24 y.o. female presenting to the [facility] Department for STI screening  1. Screening for STD (sexually transmitted disease) Patient into clinic without symptoms. Patient declines screening exam and blood work today.  Requests treatment for Syphilis only. Rec condoms with all sex. Await test results.  Counseled that RN will call if needs to RTC for treatment once results are back.  2. Syphilis contact Counseled patient that she really should have her blood drawn for RPR so that we know whether she needs one set or three sets of Bicillin. Patient states that she will get 3 sets of Bicillin to treat Syphilis since she does not want her blood drawn today. Bicillin 2.4 mu IM today, in 1 week and in 2 weeks. Reviewed SE and sequelae of Syphilis that has not been properly treated. - penicillin g benzathine (BICILLIN LA) 1200000 UNIT/2ML injection 2.4 Million Units     Return for 11/21/2019 tx#2, and 11/28/2019  tx#3, New OB appt, and PRN.  Future Appointments  Date Time Provider Department Center  11/21/2019 10:00 AM AC-STI PROVIDER [facility] None  11/28/2019 10:00 AM AC-STI PROVIDER AC-STI None    Matt Holmes, PA

## 2019-11-14 NOTE — Progress Notes (Signed)
Pt received tx with Bicillin 2.65mu IM and pt tolerated well. Pt scheduled for Bicillin tx #2 and #3 per provider verbal order. Appt cards given to pt and pt aware of appt dates and times and to arrive early for check-in. Counseled pt on importance of staying for full 20 minutes to be monitored for any adverse reactions and pt states understanding. Pt stayed for ~10 minutes and then left against medical advice. Provider orders completed.

## 2019-11-15 ENCOUNTER — Encounter: Payer: Self-pay | Admitting: Physician Assistant

## 2019-11-21 ENCOUNTER — Ambulatory Visit: Payer: Self-pay

## 2019-11-22 ENCOUNTER — Other Ambulatory Visit: Payer: Self-pay

## 2019-11-22 ENCOUNTER — Ambulatory Visit: Payer: Medicaid Other | Admitting: Family Medicine

## 2019-11-22 DIAGNOSIS — Z202 Contact with and (suspected) exposure to infections with a predominantly sexual mode of transmission: Secondary | ICD-10-CM

## 2019-11-22 DIAGNOSIS — Z113 Encounter for screening for infections with a predominantly sexual mode of transmission: Secondary | ICD-10-CM

## 2019-11-22 NOTE — Progress Notes (Signed)
Patient in clinic today for treatment # 2 for contact to syphilis.  2.4 million units given per [facility] order.  Patient tolerated injection well.  Patient left AMA and did not stay to be monitored.  Patient is under the influence of some substance and speech is slurred.  Informed Deputy on Duty of patient status.  Deputy escorted patient out of building after appointment scheduled for tx #3.    Wendi Snipes, RN

## 2019-11-28 ENCOUNTER — Ambulatory Visit: Payer: Self-pay

## 2019-11-29 ENCOUNTER — Ambulatory Visit: Payer: Medicaid Other | Admitting: Physician Assistant

## 2019-11-29 ENCOUNTER — Other Ambulatory Visit: Payer: Self-pay

## 2019-11-29 DIAGNOSIS — Z202 Contact with and (suspected) exposure to infections with a predominantly sexual mode of transmission: Secondary | ICD-10-CM | POA: Diagnosis not present

## 2019-11-29 DIAGNOSIS — Z113 Encounter for screening for infections with a predominantly sexual mode of transmission: Secondary | ICD-10-CM | POA: Diagnosis not present

## 2019-11-29 NOTE — Progress Notes (Signed)
Pt counseled to seek prenatal care ASAP. Pt given list of free Covid testing sites and encouraged PCR test.

## 2019-11-29 NOTE — Progress Notes (Signed)
Patient into clinic for #3 of 3 sets of Bicillin 2.4 mu IM for treatment as a contact to Syphilis.  Patient reports to CNA that she has had a fever and nausea and vomiting last night and does not feel well.  Rec to CNA that she move patient to triage room.  Notified DON of situation and she recommends that we use mask, gown, shield and gloves to give patient her injections.  CD RNs into clinic to administer rapid COVID test that was negative.  Patient should seek [facility] ASAP and also get PCR test to confirm negative for COVID.

## 2019-11-29 NOTE — Progress Notes (Signed)
Pt received tx#3 of Bicillin 2.5mu IM, by this RN and Tracey Harries, RN, per Sadie Haber, PA order from 11/14/2019. Pt tolerated well, but would not stay to be monitored for 20 minutes and left AMA.

## 2019-12-23 ENCOUNTER — Telehealth: Payer: Self-pay

## 2019-12-23 NOTE — Telephone Encounter (Signed)
TC patient to reschedule missed new OB appointment. Female answered and hung up the phone without saying anything but "hello". TC again and left message with number to call.Burt Knack, RN

## 2019-12-24 NOTE — Telephone Encounter (Signed)
Call to client to reschedule appt from 12/23/2019 and number to call provided.

## 2019-12-24 NOTE — Telephone Encounter (Signed)
Client never showed for new OB appointment scheduled by jail. Patient was released from jail, per Frederica Kuster, before patient was aware of appointment. Unable to reach patient, and no more follow-up to be done at this time. Patient was called on 12/23/19 in error.Burt Knack, RN

## 2021-04-30 NOTE — Progress Notes (Addendum)
04-30-2021 Received a request for medical records from [place] on behalf of [employer]. [employer] is requesting copies of prenatal and postpartum records from 03-15-2019 to 03-13-2021. Per review of patient chart, no prenatal or postpartum records available. Letter mailed to [facility] advising of the above. Letter mailed to [facility], [address], [place], [place] [address]. Herby Abraham RN.  Addendum. 04-30-2021. Included in letter to [facility] a copy of 12-23-2019 and 12-24-2019 nurse phone notes. Herby Abraham RN.

## 2021-06-22 NOTE — Progress Notes (Signed)
Received second request for medical records from [place] requesting copies of prenatal and postpartum records for dates of service 03-15-2019 to 03-13-2021. Per review of EMR, [facility] does not have prenatal or postpartum records for these dates of service. Letter mailed to [employer] advising of the above. Included in mailing, copies of 12-23-2019 and 12-24-2019 [facility] nursing telephone notes. Letter and above nursing telephone notes mailed certified mail to: [employer], [address], [place] [address]. On 06-14-2021, I also called [employer] and advised of the above. Inetta Fermo RN.  ?

## 2021-07-06 ENCOUNTER — Emergency Department
Admission: EM | Admit: 2021-07-06 | Discharge: 2021-07-06 | Discharge status: Against Medical Advice | Payer: Medicaid Other | Attending: Emergency Medicine | Admitting: Emergency Medicine

## 2021-07-06 ENCOUNTER — Other Ambulatory Visit: Payer: Self-pay

## 2021-07-06 DIAGNOSIS — R000 Tachycardia, unspecified: Secondary | ICD-10-CM | POA: Diagnosis not present

## 2021-07-06 DIAGNOSIS — T50901A Poisoning by unspecified drugs, medicaments and biological substances, accidental (unintentional), initial encounter: Secondary | ICD-10-CM

## 2021-07-06 DIAGNOSIS — T402X1A Poisoning by other opioids, accidental (unintentional), initial encounter: Secondary | ICD-10-CM | POA: Diagnosis not present

## 2021-07-06 DIAGNOSIS — D72829 Elevated white blood cell count, unspecified: Secondary | ICD-10-CM | POA: Insufficient documentation

## 2021-07-06 LAB — CBC WITH DIFFERENTIAL/PLATELET
Abs Immature Granulocytes: 0.11 10*3/uL — ABNORMAL HIGH (ref 0.00–0.07)
Basophils Absolute: 0 10*3/uL (ref 0.0–0.1)
Basophils Relative: 0 %
Eosinophils Absolute: 0 10*3/uL (ref 0.0–0.5)
Eosinophils Relative: 0 %
HCT: 47 % — ABNORMAL HIGH (ref 36.0–46.0)
Hemoglobin: 14.6 g/dL (ref 12.0–15.0)
Immature Granulocytes: 1 %
Lymphocytes Relative: 5 %
Lymphs Abs: 0.8 10*3/uL (ref 0.7–4.0)
MCH: 27.1 pg (ref 26.0–34.0)
MCHC: 31.1 g/dL (ref 30.0–36.0)
MCV: 87.2 fL (ref 80.0–100.0)
Monocytes Absolute: 1.4 10*3/uL — ABNORMAL HIGH (ref 0.1–1.0)
Monocytes Relative: 9 %
Neutro Abs: 13.2 10*3/uL — ABNORMAL HIGH (ref 1.7–7.7)
Neutrophils Relative %: 85 %
Platelets: 357 10*3/uL (ref 150–400)
RBC: 5.39 MIL/uL — ABNORMAL HIGH (ref 3.87–5.11)
RDW: 14.7 % (ref 11.5–15.5)
WBC: 15.6 10*3/uL — ABNORMAL HIGH (ref 4.0–10.5)
nRBC: 0 % (ref 0.0–0.2)

## 2021-07-06 LAB — HCG, QUANTITATIVE, PREGNANCY: hCG, Beta Chain, Quant, S: <1 m[IU]/mL (ref <5)

## 2021-07-06 LAB — ETHANOL: Alcohol, Ethyl (B): <10 mg/dL (ref <10)

## 2021-07-06 MED ORDER — NALOXONE HCL 2 MG/2ML IJ SOSY
PREFILLED_SYRINGE | INTRAMUSCULAR | Status: AC
  Filled 2021-07-06: qty 2

## 2021-07-06 MED ORDER — LACTATED RINGERS IV BOLUS
1000.0000 mL | Freq: Once | INTRAVENOUS | Status: AC
  Administered 2021-07-06: 1000 mL via INTRAVENOUS

## 2021-07-06 MED ORDER — NALOXONE HCL 0.4 MG/ML IJ SOLN: 0.2000 mg | Freq: Once | INTRAMUSCULAR | Status: DC

## 2021-07-06 MED ORDER — ONDANSETRON HCL 4 MG/2ML IJ SOLN
4.0000 mg | Freq: Once | INTRAMUSCULAR | Status: AC
  Administered 2021-07-06: 4 mg via INTRAVENOUS
  Filled 2021-07-06: qty 2

## 2021-07-06 MED ORDER — NALOXONE HCL 4 MG/0.1ML NA LIQD: NASAL | 0 refills | Status: AC

## 2021-07-06 NOTE — ED Notes (Signed)
Pt verbalized understanding of discharge instructions. Pt verbalized understanding the risk of leaving against medical advice.  ?

## 2021-07-06 NOTE — ED Provider Notes (Signed)
? ?[facility] ?Provider Note ? ? ? Event Date/Time  ? First MD Initiated Contact with Patient 07/06/21 1859   ?  (approximate) ? ? ?History  ? ?Drug Overdose ? ? ?HPI ? ?Paula MarinJamie A Hester is a 26 y.o. female with a past medical history of anxiety, bipolar disorder, depression, endometriosis and PTSD who presents via EMS for evaluation with concerns for possible overdose.  She was reportedly found in someone's car apneic by EMS.  She was given 3 mg of intranasal Narcan and EMS had to bag her until she had improvement in her respiratory rate.  She also became more alert but was reportedly confused with EMS.  On arrival to emergency room patient states she does not remember why she is here or what happened earlier today.  She is complaining that she cannot hear out of either ear.  She does not seem to be in any pain but is quite sleepy and repeatedly falls throughout the interview and further history not available from the patient arrival secondary to altered mental status. ? ?  ?Past Medical History:  ?Diagnosis Date  ? Anxiety   ? Bipolar 1 disorder (HCC)   ? Chronic pelvic pain in female 2013  ? [facility]  ? Depression   ? Endometriosis   ? PTSD (post-traumatic stress disorder)   ? ? ? ?Physical Exam  ?Triage Vital Signs: ?ED Triage Vitals  ?Enc Vitals Group  ?   BP   ?   Pulse   ?   Resp   ?   Temp   ?   Temp src   ?   SpO2   ?   Weight   ?   Height   ?   Head Circumference   ?   Peak Flow   ?   Pain Score   ?   Pain Loc   ?   Pain Edu?   ?   Excl. in GC?   ? ? ?Most recent vital signs: ?Vitals:  ? 07/06/21 2200 07/06/21 2300  ?BP: 120/78 102/77  ?Pulse: (!) 104 90  ?Resp: 14 15  ?Temp:  98.4 ?F (36.9 ?C)  ?SpO2: 98% 94%  ? ? ?General: Sleepy but arousable to sternal rub. ?CV:  Good peripheral perfusion.  2+ radial pulses.  Slightly tachycardic. ?Resp:  Normal effort.  Bilaterally. ?Abd:  No distention.  Soft bilateral. ?Other:  2+ radial pulses.  No obvious trauma to the face scalp neck or  extremities.  TMs unremarkable bilaterally.  Patient moving all extremity spontaneously and able to give this examiner thumbs up and move her toes on command.  Sensation is intact to light touch throughout. ? ? ?ED Results / Procedures / Treatments  ?Labs ?(all labs ordered are listed, but only abnormal results are displayed) ?Labs Reviewed  ?CBC WITH DIFFERENTIAL/PLATELET - Abnormal; Notable for the following components:  ?    Result Value  ? WBC 15.6 (*)   ? RBC 5.39 (*)   ? HCT 47.0 (*)   ? Neutro Abs 13.2 (*)   ? Monocytes Absolute 1.4 (*)   ? Abs Immature Granulocytes 0.11 (*)   ? All other components within normal limits  ?ETHANOL  ?HCG, QUANTITATIVE, PREGNANCY  ? ? ? ?EKG ? ?ECG is remarkable sinus tachycardia with a ventricular rate of 108, otherwise normal axis with no clear evidence of acute ischemia or significant arrhythmia. ? ? ?RADIOLOGY ? ? ? ?PROCEDURES: ? ?Critical Care performed: No ? ?.1-3 Lead  EKG Interpretation ?Performed by: Gilles Chiquito, MD ?Authorized by: Gilles Chiquito, MD  ? ?  Interpretation: non-specific   ?  ECG rate assessment: tachycardic   ?  Rhythm: sinus rhythm   ?  Ectopy: none   ?  Conduction: normal   ? ?The patient is on the cardiac monitor to evaluate for evidence of arrhythmia and/or significant heart rate changes. ? ? ?MEDICATIONS ORDERED IN ED: ?Medications  ?naloxone [facility]) injection 0.2 mg (0.2 mg Intravenous Not Given 07/06/21 2239)  ?naloxone [facility]) 2 MG/2ML injection (  Not Given 07/06/21 2104)  ?ondansetron [facility]) injection 4 mg (4 mg Intravenous Given 07/06/21 2129)  ?lactated ringers bolus 1,000 mL (0 mLs Intravenous Stopped 07/06/21 2249)  ? ? ? ?IMPRESSION / MDM / ASSESSMENT AND PLAN / ED COURSE  ?I reviewed the triage vital signs and the nursing notes. ?             ?               ? ?Patient presents with both the history and exam after being found unresponsive with EMS having received Narcan in the field with improvement in her mental status and concern  for overdose.  On arrival patient is very sleepy and seems very hard of hearing but otherwise with a nonfocal neurological exam.  She is denying any SI HI and does not seem acutely intoxicated. ? ?I suspect likely accidental overdose.  She is denying any other acute concerns at this time other than stating she does not remember what happened earlier. ? ?Pregnancy test is negative.  CBC obtained shows WBC count 15.6, nonspecific suspect is reactive in the setting of acute overdose.  Ethanol level undetectable.  Initial plan was to obtain some electrolytes as well although these hemolyzed and patient is refusing any additional IV blood work.  On several reassessments she is quite sleepy but easily arousable.  After about 3 hours in the emergency room she stated she was to leave and did not wish to stay for any additional observation or repeat blood work.  She was noted to be intermittently sleepy over the preceding 20 minutes I strongly advised patient I recommend she stay for additional observation, was concerned she might still have small amount of opioid opiate in her system and that if she were to become apneic or hypoxic she would require repeat dose of Narcan.  She did receive some Zofran IV fluids and had improvement in her heart rate.  She states she is feeling better and of note her hearing loss is greatly improved.  Unclear etiology for this although does have a suspicion for trauma or acute infectious process is possible this is related to toxicity from her overdose.  On final conversation patient is awake alert and oriented x4 stating she wishes to leave against my advice understanding that she could become apneic and stop breathing if she does not continue to be observed for another additional hour.  I think she is capacity to make this decision she was discharged against my advice.  Rx for Narcan provided. ? ?  ? ? ?FINAL CLINICAL IMPRESSION(S) / ED DIAGNOSES  ? ?Final diagnoses:  ?Accidental overdose,  initial encounter  ? ? ? ?Rx / DC Orders  ? ?ED Discharge Orders   ? ?      Ordered  ?  naloxone (NARCAN) nasal spray 4 mg/0.1 mL       ? 07/06/21 2216  ? ?  ?  ? ?  ? ? ? ?  Note:  This document was prepared using Dragon voice recognition software and may include unintentional dictation errors. ?  ?Gilles Chiquito, MD ?07/06/21 2326 ? ?

## 2021-07-06 NOTE — ED Notes (Signed)
RN attempted to recollect blood work. Pt refused to let RN collect blood work.  ?

## 2021-07-06 NOTE — ED Triage Notes (Addendum)
Pt coming via [facility]. Per EMS pt was found unresponsive in car in someone else's driveway. Pt was apneic and EMS had to bag pt. Pt was given 3 mg of Narcan total.  ? ?Pt response to voice. Pt is too confused to answer triage questions.  ?

## 2021-07-06 NOTE — ED Notes (Signed)
This RN attempted to start IV on pt, RN unsuccessful. MD aware.  ?

## 2023-11-12 ENCOUNTER — Emergency Department
Admission: EM | Admit: 2023-11-12 | Discharge: 2023-11-12 | Disposition: A | Discharge status: Home / Self Care | Payer: Self-pay | Attending: Emergency Medicine | Admitting: Emergency Medicine

## 2023-11-12 ENCOUNTER — Other Ambulatory Visit: Payer: Self-pay

## 2023-11-12 DIAGNOSIS — W57XXXA Bitten or stung by nonvenomous insect and other nonvenomous arthropods, initial encounter: Secondary | ICD-10-CM | POA: Insufficient documentation

## 2023-11-12 DIAGNOSIS — S80862A Insect bite (nonvenomous), left lower leg, initial encounter: Secondary | ICD-10-CM | POA: Insufficient documentation

## 2023-11-12 MED ORDER — DOXYCYCLINE HYCLATE 100 MG PO TABS: 100.0000 mg | ORAL_TABLET | Freq: Two times a day (BID) | ORAL | 0 refills | Status: AC

## 2023-11-12 MED ORDER — MUPIROCIN 2 % EX OINT: 1.0000 | TOPICAL_OINTMENT | Freq: Two times a day (BID) | CUTANEOUS | 0 refills | Status: AC

## 2023-11-12 MED ORDER — DOXYCYCLINE HYCLATE 100 MG PO TABS
100.0000 mg | ORAL_TABLET | Freq: Once | ORAL | Status: AC
  Administered 2023-11-12: 100 mg via ORAL
  Filled 2023-11-12: qty 1

## 2023-11-12 NOTE — ED Triage Notes (Signed)
 Patient ambulatory to triage with complaints of spider bite to left leg. Complaints of increasing redness, swelling, and pain. States this happened a couple of days ago and had drainage.

## 2023-11-12 NOTE — ED Notes (Signed)
 Patient instructed not to lance or dig out insect bite site to leg.

## 2023-11-12 NOTE — ED Provider Notes (Signed)
 [place] EMERGENCY DEPARTMENT AT [facility] Provider Note   CSN: [id number] Arrival date & time: 11/12/23  2043     Patient presents with: Insect Bite   Paula Hester is a 28 y.o. female.   Patient here for insect bite.  Noting a bite to the back of left calf this happened a few days ago and has since been draining.  Denying any fevers but has had some chills.  No swelling no injury no history of VTE.        Prior to Admission medications   Medication Sig Start Date End Date Taking? Authorizing Provider  doxycycline  (VIBRA -TABS) 100 MG tablet Take 1 tablet (100 mg total) by mouth 2 (two) times daily for 10 days. 11/12/23 11/22/23 Yes Kingston Mallick, PA-C  mupirocin  ointment (BACTROBAN ) 2 % Apply 1 Application topically 2 (two) times daily. 11/12/23  Yes Kingston Mallick, PA-C  naloxone  (NARCAN ) nasal spray 4 mg/0.1 mL For any concern for opioid overdose 07/06/21   Claudene Arthea SQUIBB, MD    Allergies: Lamictal [lamotrigine], Naproxen, Oxcarbazepine, Hydrocodone, and Morphine  and codeine    Review of Systems  All other systems reviewed and are negative.   Updated Vital Signs BP 113/74   Pulse (!) 116   Temp 98.4 F (36.9 C) (Oral)   Resp 18   Ht 5' 2 (1.575 m)   Wt 52.2 kg   LMP  (LMP Unknown)   SpO2 96%   BMI 21.03 kg/m   Physical Exam Vitals reviewed.  Constitutional:      Appearance: Normal appearance.  HENT:     Head: Normocephalic and atraumatic.     Nose: Nose normal.  Cardiovascular:     Pulses: Normal pulses.  Pulmonary:     Effort: Pulmonary effort is normal.  Musculoskeletal:     Cervical back: Normal range of motion.  Skin:    Comments: Redness with punctate lesion posterior left calf no tenderness.  Dorsal pedal pulse intact.  Sensation intact distally.  Neurological:     Mental Status: She is alert and oriented to person, place, and time. Mental status is at baseline.  Psychiatric:        Mood and Affect: Mood normal.        Behavior:  Behavior normal.     (all labs ordered are listed, but only abnormal results are displayed) Labs Reviewed - No data to display  EKG: None  Radiology: No results found.   Procedures   Medications Ordered in the ED  doxycycline  (VIBRA -TABS) tablet 100 mg (has no administration in time range)                                    Medical Decision Making Patient here with lesion posterior left calf consistent with infection cellulitis likely secondary to some sort of bug bite.  She has no calf swelling do not suspect VTE.  She is somewhat agitated consistent with her history of mental health.  No SI or HI.  Denies current pregnancy.  Risk Prescription drug management.        Final diagnoses:  Insect bite, unspecified site, initial encounter    ED Discharge Orders          Ordered    doxycycline  (VIBRA -TABS) 100 MG tablet  2 times daily        11/12/23 2115    mupirocin  ointment (BACTROBAN ) 2 %  2 times daily  11/12/23 2115               Kingston Mallick, PA-C 11/12/23 2115    Jacolyn Pae, MD 11/12/23 2312

## 2023-12-25 ENCOUNTER — Other Ambulatory Visit: Payer: Self-pay

## 2023-12-25 ENCOUNTER — Emergency Department
Admission: EM | Admit: 2023-12-25 | Discharge: 2023-12-25 | Disposition: A | Discharge status: Home / Self Care | Attending: Emergency Medicine | Admitting: Emergency Medicine

## 2023-12-25 DIAGNOSIS — J069 Acute upper respiratory infection, unspecified: Secondary | ICD-10-CM | POA: Diagnosis not present

## 2023-12-25 DIAGNOSIS — N3000 Acute cystitis without hematuria: Secondary | ICD-10-CM | POA: Insufficient documentation

## 2023-12-25 DIAGNOSIS — J029 Acute pharyngitis, unspecified: Secondary | ICD-10-CM | POA: Diagnosis present

## 2023-12-25 LAB — URINALYSIS, ROUTINE W REFLEX MICROSCOPIC
Bilirubin Urine: NEGATIVE
Glucose, UA: NEGATIVE mg/dL
Ketones, ur: 5 mg/dL — AB
Nitrite: NEGATIVE
Protein, ur: 100 mg/dL — AB
Specific Gravity, Urine: 1.01 (ref 1.005–1.030)
WBC, UA: >50 WBC/hpf (ref 0–5)
pH: 6 (ref 5.0–8.0)

## 2023-12-25 LAB — POC URINE PREG, ED: Preg Test, Ur: NEGATIVE

## 2023-12-25 LAB — HCG, QUANTITATIVE, PREGNANCY: hCG, Beta Chain, Quant, S: <1 m[IU]/mL (ref <5)

## 2023-12-25 LAB — RESP PANEL BY RT-PCR (RSV, FLU A&B, COVID)  RVPGX2
Influenza A by PCR: NEGATIVE
Influenza B by PCR: NEGATIVE
Resp Syncytial Virus by PCR: NEGATIVE
SARS Coronavirus 2 by RT PCR: NEGATIVE

## 2023-12-25 MED ORDER — CEPHALEXIN 500 MG PO CAPS: 500.0000 mg | ORAL_CAPSULE | Freq: Two times a day (BID) | ORAL | 0 refills | Status: AC

## 2023-12-25 NOTE — ED Provider Notes (Signed)
 [facility] Provider Note    Event Date/Time   First MD Initiated Contact with Patient 12/25/23 1745     (approximate)   History   flu like symptoms   HPI  Paula Hester is a 28 year old female presenting to the emergency department for multiple complaints.  Reports that multiple family members have been sick with sore throat, upper respiratory symptoms over the past few days.  Denies concern for carbon monoxide exposure.  Additionally notes that she has had an irregular.  For the past several months and is concerned she may be pregnant.  Does additionally note that she has had some urinary hesitancy, concern for possible UTI.  Denies dysuria.  Denies chest pain, shortness of breath, abdominal pain.     Physical Exam   Triage Vital Signs: ED Triage Vitals  Encounter Vitals Group     BP 12/25/23 1552 111/66     Girls Systolic BP Percentile --      Girls Diastolic BP Percentile --      Boys Systolic BP Percentile --      Boys Diastolic BP Percentile --      Pulse Rate 12/25/23 1552 (!) 108     Resp 12/25/23 1552 18     Temp 12/25/23 1552 98 F (36.7 C)     Temp src --      SpO2 12/25/23 1552 100 %     Weight 12/25/23 1550 120 lb (54.4 kg)     Height 12/25/23 1550 5' 2 (1.575 m)     Head Circumference --      Peak Flow --      Pain Score 12/25/23 1550 3     Pain Loc --      Pain Education --      Exclude from Growth Chart --     Most recent vital signs: Vitals:   12/25/23 1552  BP: 111/66  Pulse: (!) 108  Resp: 18  Temp: 98 F (36.7 C)  SpO2: 100%     General: Awake, interactive, eating a bag of chips when I walk in CV:  Good peripheral perfusion, regular rate at time of my evaluation Resp:  Unlabored respirations, lungs clear to auscultation Abd:  Nondistended, soft, nontender Neuro:  Symmetric facial movement, fluid speech, moving extremities spontaneously and equally   ED Results / Procedures / Treatments   Labs (all  labs ordered are listed, but only abnormal results are displayed) Labs Reviewed  URINALYSIS, ROUTINE W REFLEX MICROSCOPIC - Abnormal; Notable for the following components:      Result Value   Color, Urine YELLOW (*)    APPearance CLOUDY (*)    Hgb urine dipstick MODERATE (*)    Ketones, ur 5 (*)    Protein, ur 100 (*)    Leukocytes,Ua LARGE (*)    Bacteria, UA MANY (*)    All other components within normal limits  RESP PANEL BY RT-PCR (RSV, FLU A&B, COVID)  RVPGX2  URINE CULTURE  HCG, QUANTITATIVE, PREGNANCY  POC URINE PREG, ED     EKG EKG independently reviewed and interpreted by myself demonstrates:    RADIOLOGY Imaging independently reviewed and interpreted by myself demonstrates:   Formal Radiology Read:  No results found.  PROCEDURES:  Critical Care performed: No  Procedures   MEDICATIONS ORDERED IN ED: Medications - No data to display   IMPRESSION / MDM / ASSESSMENT AND PLAN / ED COURSE  I reviewed the triage vital signs and the nursing  notes.  Differential diagnosis includes, but is not limited to, viral illness, UTI, pregnancy, very low suspicion acute intra-abdominal process given reassuring abdominal exam  Patient's presentation is most consistent with acute complicated illness / injury requiring diagnostic workup.  28 year old female presenting with upper respiratory symptoms, urinary hesitancy.  Mild tachycardia in triage, improved at the time of my initial evaluation.  Viral panel negative.  UPT negative.  Urinalysis with some contamination but is concerning for infection.  Given patient's associated urinary symptoms, do think empiric treatment is reasonable.  Urine culture sent.  Patient updated on results of workup.  Comfortable with discharge home.  Will DC with prescription for Keflex .  Strict return precautions provided.  Patient discharged in stable condition.      FINAL CLINICAL IMPRESSION(S) / ED DIAGNOSES   Final diagnoses:  Acute cystitis  without hematuria  Upper respiratory tract infection, unspecified type     Rx / DC Orders   ED Discharge Orders          Ordered    cephALEXin  (KEFLEX ) 500 MG capsule  2 times daily        12/25/23 1823             Note:  This document was prepared using Dragon voice recognition software and may include unintentional dictation errors.   Levander Slate, MD 12/25/23 [phone]

## 2023-12-25 NOTE — Discharge Instructions (Signed)
 Your urine was concerning for infection.  I sent a antibiotic to your pharmacy.  I suspect you may also have a viral illness causing your upper respiratory symptoms.  Follow with a primary care doctor for further evaluation if your symptoms are not improving.  Return to the ER for new or worsening symptoms.

## 2023-12-25 NOTE — ED Notes (Signed)
 Pt seen walking to the parking lot

## 2023-12-25 NOTE — ED Notes (Signed)
 Unable to get labs from pt . Lab called for assistance.

## 2023-12-25 NOTE — ED Triage Notes (Signed)
 Pt comes with flu lik symptoms for three days. Pt states it has gone around her household. Pt also states she might be pregnant. Pt missed last months period.

## 2023-12-26 LAB — URINE CULTURE

## 2024-04-12 ENCOUNTER — Other Ambulatory Visit: Payer: Self-pay

## 2024-04-12 DIAGNOSIS — J069 Acute upper respiratory infection, unspecified: Secondary | ICD-10-CM | POA: Insufficient documentation

## 2024-04-12 DIAGNOSIS — R059 Cough, unspecified: Secondary | ICD-10-CM | POA: Diagnosis present

## 2024-04-12 DIAGNOSIS — R202 Paresthesia of skin: Secondary | ICD-10-CM | POA: Insufficient documentation

## 2024-04-12 NOTE — ED Triage Notes (Signed)
 PT BIB EMS in officer custody- complains of neuropathy, swelling, burning and tingling in both feet and feeling sick for a few weeks. Went to hospital a few weeks ago and tested negative for Covid and Flu but symptoms are not getting better.

## 2024-04-13 ENCOUNTER — Emergency Department
Admission: EM | Admit: 2024-04-13 | Discharge: 2024-04-13 | Attending: Emergency Medicine | Admitting: Emergency Medicine

## 2024-04-13 ENCOUNTER — Emergency Department

## 2024-04-13 DIAGNOSIS — R200 Anesthesia of skin: Secondary | ICD-10-CM

## 2024-04-13 DIAGNOSIS — R202 Paresthesia of skin: Secondary | ICD-10-CM

## 2024-04-13 DIAGNOSIS — J069 Acute upper respiratory infection, unspecified: Secondary | ICD-10-CM

## 2024-04-13 LAB — RESP PANEL BY RT-PCR (RSV, FLU A&B, COVID)  RVPGX2
Influenza A by PCR: NEGATIVE
Influenza B by PCR: NEGATIVE
Resp Syncytial Virus by PCR: NEGATIVE
SARS Coronavirus 2 by RT PCR: NEGATIVE

## 2024-04-13 MED ORDER — AZITHROMYCIN 250 MG PO TABS: ORAL_TABLET | ORAL | 0 refills | Status: AC

## 2024-04-13 NOTE — ED Notes (Signed)
Swab sent to lab at this time.  °

## 2024-04-13 NOTE — ED Provider Notes (Signed)
 "  [facility] Provider Note    Event Date/Time   First MD Initiated Contact with Patient 04/13/24 0304     (approximate)   History   No chief complaint on file.   HPI  Paula Hester is a 29 y.o. female   Past medical history of bipolar, anxiety, depression, endometriosis, PTSD here in police custody.  When police were bringing her and she complained about a cough and cold symptoms for the last couple days as well as burning tingling sensation to both hands and feet.  No other acute medical complaints.  Independent Historian contributed to assessment above: Emergency planning/management officer at bedside corroborates information above      Physical Exam   Triage Vital Signs: ED Triage Vitals  Encounter Vitals Group     BP 04/12/24 2159 106/66     Girls Systolic BP Percentile --      Girls Diastolic BP Percentile --      Boys Systolic BP Percentile --      Boys Diastolic BP Percentile --      Pulse Rate 04/12/24 2159 (!) 110     Resp 04/12/24 2159 20     Temp 04/12/24 2159 98.6 F (37 C)     Temp src --      SpO2 04/12/24 2159 96 %     Weight 04/12/24 2157 125 lb (56.7 kg)     Height 04/12/24 2157 5' 2 (1.575 m)     Head Circumference --      Peak Flow --      Pain Score 04/12/24 2156 9     Pain Loc --      Pain Education --      Exclude from Growth Chart --     Most recent vital signs: Vitals:   04/12/24 2159 04/13/24 0508  BP: 106/66 110/72  Pulse: (!) 110 (!) 102  Resp: 20 16  Temp: 98.6 F (37 C)   SpO2: 96% 98%    General: Awake, no distress.  CV:  Good peripheral perfusion.  Resp:  Normal effort.  Abd:  No distention.  Other:  Overall well-appearing.  No motor or sensory deficits ambulatory with steady gait no facial asymmetry or dysarthria.  Clear lungs without focality or wheezing.  Soft benign abdominal exam.  Nontoxic-appearing overall neck supple w full motion.   ED Results / Procedures / Treatments   Labs (all labs ordered are  listed, but only abnormal results are displayed) Labs Reviewed  RESP PANEL BY RT-PCR (RSV, FLU A&B, COVID)  [id number]     I ordered and reviewed the above labs they are notable for viral testing negative.    RADIOLOGY I independently reviewed and interpreted chest x-ray and bilateral hilar focalities, nonspecific I also reviewed radiologist's formal read.   PROCEDURES:  Critical Care performed: No  Procedures   MEDICATIONS ORDERED IN ED: Medications - No data to display   IMPRESSION / MDM / ASSESSMENT AND PLAN / ED COURSE  I reviewed the triage vital signs and the nursing notes.                                Patient's presentation is most consistent with acute presentation with potential threat to life or bodily function.  Differential diagnosis includes, but is not limited to, viral URI, pneumonia, peripheral neuropathy, considered but less likely stroke   MDM:    Patient here with  viral URI symptoms cough congestion but appears well doubt more severe life-threatening infections like sepsis meningitis.  X-ray with some hilar diffuse opacities that may represent atypical pneumonia, final read was very much delayed and so I started a Z-Pak for her upon discharge back to police custody.  Prescription tingling in the hands and feet without any focal neurologic deficits unlikely to represent neurologic emergencies like stroke.  She will follow-up with PMD for this problem.  Safe for discharge.        FINAL CLINICAL IMPRESSION(S) / ED DIAGNOSES   Final diagnoses:  Tingling of both feet  Numbness and tingling in both hands  Viral URI with cough     Rx / DC Orders   ED Discharge Orders          Ordered    Ambulatory Referral to Primary Care (Establish Care)        04/13/24 0307    azithromycin  (ZITHROMAX  Z-PAK) 250 MG tablet        04/13/24 0455             Note:  This document was prepared using Dragon voice recognition software and may include  unintentional dictation errors.    Cyrena Mylar, MD 04/13/24 [phone]  "
